# Patient Record
Sex: Female | Born: 1970 | Race: White | Hispanic: No | Marital: Married | Smoking: Former smoker
Health system: Southern US, Community
[De-identification: ages and names within clinical notes are randomized; demographics above are authoritative.]

## PROBLEM LIST (undated history)

## (undated) ENCOUNTER — Emergency Department (HOSPITAL_BASED_OUTPATIENT_CLINIC_OR_DEPARTMENT_OTHER)

## (undated) DIAGNOSIS — R112 Nausea with vomiting, unspecified: Secondary | ICD-10-CM

## (undated) DIAGNOSIS — G4700 Insomnia, unspecified: Secondary | ICD-10-CM

## (undated) DIAGNOSIS — C801 Malignant (primary) neoplasm, unspecified: Secondary | ICD-10-CM

## (undated) DIAGNOSIS — N6452 Nipple discharge: Secondary | ICD-10-CM

## (undated) DIAGNOSIS — K219 Gastro-esophageal reflux disease without esophagitis: Secondary | ICD-10-CM

## (undated) DIAGNOSIS — M1990 Unspecified osteoarthritis, unspecified site: Secondary | ICD-10-CM

## (undated) DIAGNOSIS — E039 Hypothyroidism, unspecified: Secondary | ICD-10-CM

## (undated) DIAGNOSIS — K644 Residual hemorrhoidal skin tags: Secondary | ICD-10-CM

## (undated) DIAGNOSIS — F419 Anxiety disorder, unspecified: Secondary | ICD-10-CM

## (undated) DIAGNOSIS — R040 Epistaxis: Secondary | ICD-10-CM

## (undated) DIAGNOSIS — N644 Mastodynia: Secondary | ICD-10-CM

## (undated) DIAGNOSIS — D649 Anemia, unspecified: Secondary | ICD-10-CM

## (undated) DIAGNOSIS — Z9889 Other specified postprocedural states: Secondary | ICD-10-CM

## (undated) DIAGNOSIS — Z9289 Personal history of other medical treatment: Secondary | ICD-10-CM

## (undated) HISTORY — PX: STAPLE HEMORRHOIDECTOMY: SHX2438

## (undated) HISTORY — DX: Nipple discharge: N64.52

## (undated) HISTORY — DX: Residual hemorrhoidal skin tags: K64.4

## (undated) HISTORY — PX: OTHER SURGICAL HISTORY: SHX169

## (undated) HISTORY — PX: ABDOMINAL HYSTERECTOMY: SHX81

## (undated) HISTORY — DX: Mastodynia: N64.4

## (undated) HISTORY — DX: Epistaxis: R04.0

## (undated) HISTORY — PX: ESSURE TUBAL LIGATION: SUR464

## (undated) HISTORY — PX: ADENOIDECTOMY: SUR15

## (undated) HISTORY — PX: EYE SURGERY: SHX253

## (undated) HISTORY — PX: WISDOM TOOTH EXTRACTION: SHX21

---
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
## 1990-12-18 HISTORY — PX: TONSILLECTOMY: SUR1361

## 2003-06-19 ENCOUNTER — Inpatient Hospital Stay (HOSPITAL_COMMUNITY): Admission: AD | Admit: 2003-06-19 | Discharge: 2003-06-21 | Payer: Self-pay | Admitting: Obstetrics and Gynecology

## 2003-07-30 ENCOUNTER — Other Ambulatory Visit: Admission: RE | Admit: 2003-07-30 | Discharge: 2003-07-30 | Payer: Self-pay | Admitting: Obstetrics and Gynecology

## 2003-12-19 HISTORY — PX: BREAST SURGERY: SHX581

## 2004-08-17 ENCOUNTER — Encounter: Admission: RE | Admit: 2004-08-17 | Discharge: 2004-08-17 | Payer: Self-pay | Admitting: Obstetrics and Gynecology

## 2004-11-02 ENCOUNTER — Ambulatory Visit (HOSPITAL_COMMUNITY): Admission: RE | Admit: 2004-11-02 | Discharge: 2004-11-02 | Payer: Self-pay | Admitting: *Deleted

## 2004-11-02 ENCOUNTER — Ambulatory Visit (HOSPITAL_BASED_OUTPATIENT_CLINIC_OR_DEPARTMENT_OTHER): Admission: RE | Admit: 2004-11-02 | Discharge: 2004-11-02 | Payer: Self-pay | Admitting: *Deleted

## 2004-11-02 ENCOUNTER — Encounter (INDEPENDENT_AMBULATORY_CARE_PROVIDER_SITE_OTHER): Payer: Self-pay | Admitting: Specialist

## 2006-08-14 ENCOUNTER — Encounter: Admission: RE | Admit: 2006-08-14 | Discharge: 2006-08-14 | Payer: Self-pay | Admitting: Obstetrics and Gynecology

## 2006-08-27 ENCOUNTER — Encounter (INDEPENDENT_AMBULATORY_CARE_PROVIDER_SITE_OTHER): Payer: Self-pay | Admitting: *Deleted

## 2006-08-27 ENCOUNTER — Ambulatory Visit (HOSPITAL_BASED_OUTPATIENT_CLINIC_OR_DEPARTMENT_OTHER): Admission: RE | Admit: 2006-08-27 | Discharge: 2006-08-27 | Payer: Self-pay | Admitting: General Surgery

## 2006-08-27 HISTORY — PX: BREAST BIOPSY: SHX20

## 2007-08-21 ENCOUNTER — Encounter: Admission: RE | Admit: 2007-08-21 | Discharge: 2007-08-21 | Payer: Self-pay | Admitting: Obstetrics and Gynecology

## 2008-07-24 ENCOUNTER — Encounter: Admission: RE | Admit: 2008-07-24 | Discharge: 2008-07-24 | Payer: Self-pay | Admitting: Obstetrics and Gynecology

## 2009-09-06 ENCOUNTER — Encounter: Admission: RE | Admit: 2009-09-06 | Discharge: 2009-09-06 | Payer: Self-pay | Admitting: Obstetrics and Gynecology

## 2010-02-21 ENCOUNTER — Ambulatory Visit: Payer: Self-pay | Admitting: Genetic Counselor

## 2010-06-16 ENCOUNTER — Ambulatory Visit (HOSPITAL_COMMUNITY): Admission: RE | Admit: 2010-06-16 | Discharge: 2010-06-16 | Payer: Self-pay | Admitting: Obstetrics and Gynecology

## 2010-09-07 ENCOUNTER — Encounter: Admission: RE | Admit: 2010-09-07 | Discharge: 2010-09-07 | Payer: Self-pay | Admitting: Obstetrics and Gynecology

## 2010-12-22 ENCOUNTER — Ambulatory Visit (HOSPITAL_COMMUNITY)
Admission: RE | Admit: 2010-12-22 | Discharge: 2010-12-22 | Payer: Self-pay | Source: Home / Self Care | Attending: Obstetrics and Gynecology | Admitting: Obstetrics and Gynecology

## 2010-12-27 ENCOUNTER — Encounter
Admission: RE | Admit: 2010-12-27 | Discharge: 2010-12-27 | Payer: Self-pay | Source: Home / Self Care | Attending: Obstetrics and Gynecology | Admitting: Obstetrics and Gynecology

## 2011-01-03 ENCOUNTER — Ambulatory Visit (HOSPITAL_COMMUNITY)
Admission: RE | Admit: 2011-01-03 | Discharge: 2011-01-03 | Payer: Self-pay | Source: Home / Self Care | Attending: Interventional Radiology | Admitting: Interventional Radiology

## 2011-01-04 LAB — CBC
HCT: 44.1 % (ref 36.0–46.0)
Hemoglobin: 15.1 g/dL — ABNORMAL HIGH (ref 12.0–15.0)
MCH: 32.2 pg (ref 26.0–34.0)
MCHC: 34.2 g/dL (ref 30.0–36.0)
MCV: 94 fL (ref 78.0–100.0)
Platelets: 249 10*3/uL (ref 150–400)
RBC: 4.69 MIL/uL (ref 3.87–5.11)
RDW: 12.2 % (ref 11.5–15.5)
WBC: 4.9 10*3/uL (ref 4.0–10.5)

## 2011-01-09 ENCOUNTER — Encounter: Payer: Self-pay | Admitting: Obstetrics and Gynecology

## 2011-03-30 ENCOUNTER — Other Ambulatory Visit: Payer: Self-pay | Admitting: Interventional Radiology

## 2011-03-30 DIAGNOSIS — I872 Venous insufficiency (chronic) (peripheral): Secondary | ICD-10-CM

## 2011-03-30 DIAGNOSIS — R102 Pelvic and perineal pain: Secondary | ICD-10-CM

## 2011-04-04 ENCOUNTER — Encounter: Payer: Self-pay | Admitting: Radiology

## 2011-05-05 NOTE — Op Note (Signed)
Alyssa Contreras, Alyssa Contreras               ACCOUNT NO.:  [id number]   MEDICAL RECORD NO.:  [id number]          PATIENT TYPE:  AMB   LOCATION:  [facility]                         FACILITY:  [facility]   PHYSICIAN:  Vikki Ports, MDDATE OF BIRTH:  04-22-1971   DATE OF PROCEDURE:  11/02/2004  DATE OF DISCHARGE:                                 OPERATIVE REPORT   PREOPERATIVE DIAGNOSIS:  Grade 2 and 3 internal hemorrhoids.   POSTOPERATIVE DIAGNOSIS:  Grade 2 and 3 internal hemorrhoids.   PROCEDURE:  Procedure for prolapse and hemorrhoids retropexy.   SURGEON:  Vikki Ports, MD   ANESTHESIA:  General.   DESCRIPTION OF PROCEDURE:  Patient was taken to the operating room and  placed in the prone jack-knife position.  Rectal and perianal prep were  undertaken.  Additional anal dilatation was accomplished at three fingers.  Hemorrhoidal bundle were injected at 0.25% Marcaine with Wydase.  Internal  and external sphincter muscles were injected with 0.25 Marcaine.  A 2-0  Prolene purse-string suture was placed 5 cm proximal to the dentate line.  I  had difficulty initially getting the stapler above the purse-string;  therefore, the purse-string was removed.  The patient's position made it  difficult to visualize but using a different scope, I managed a very  adequate purse-string suture, placed the stapler in, held it close for 30  seconds and fired.  I had a good donut of approximately 2 cm of hemorrhoidal  tissue.  No evidence of muscularis.  The staple line was inspected.  There  was a minimal amount of oozing, which was controlled with electrocautery.  Gelfoam packing was placed.  Patient tolerated the procedure well and went  to the PACU in good condition.     Gaylyn Rong   KRH/MEDQ  D:  11/02/2004  T:  11/02/2004  Job:  [id number]

## 2011-05-05 NOTE — Op Note (Signed)
Alyssa Contreras, Alyssa Contreras               ACCOUNT NO.:  [id number]   MEDICAL RECORD NO.:  [id number]          PATIENT TYPE:  AMB   LOCATION:  [facility]                          FACILITY:  [facility]   PHYSICIAN:  Rose Phi. Maple Hudson, M.D.   DATE OF BIRTH:  [birth year]   DATE OF PROCEDURE:  08/27/2006  DATE OF DISCHARGE:                                 OPERATIVE REPORT   PREOPERATIVE DIAGNOSIS:  Intraductal papilloma of the left breast.   POSTOPERATIVE DIAGNOSIS:  Intraductal papilloma of the left breast.   OPERATION:  Excision of duct system, left breast.   SURGEON:  Rose Phi. Maple Hudson, M.D.   ANESTHESIA:  MAC.   OPERATIVE PROCEDURE:  The patient was placed on the operating table with  arms extended on the arm board and the left breast prepped and draped in the  usual fashion.  The area of the papilloma and the bloody nipple discharge  was at the 2 o'clock position, so a circumareolar incision centered at the 2  o'clock position was then outlined with a marking pencil and the area  thoroughly infiltrated with 0.25% Marcaine and 1% Xylocaine.  The incision  was made and then the areolar flap dissected up and then what appeared to be  a papilloma excised with the duct system.  Hemostasis was obtained with the  cautery.  Subcuticular closure of 4-0 Monocryl and Steri-Strips was carried  out.  Dressing applied.  The patient was transferred to the recovery room in  satisfactory condition, having tolerated the procedure well.      Rose Phi. Maple Hudson, M.D.  Electronically Signed     PRY/MEDQ  D:  08/27/2006  T:  08/28/2006  Job:  [id number]

## 2011-05-05 NOTE — H&P (Signed)
   Alyssa Contreras, Alyssa Contreras                         ACCOUNT NO.:  [id number]   MEDICAL RECORD NO.:  [id number]                   PATIENT TYPE:  MAT   LOCATION:  [facility]                                 FACILITY:  [facility]   PHYSICIAN:  Lenoard Aden, M.D.             DATE OF BIRTH:  07/21/1971   DATE OF ADMISSION:  06/27/2003  DATE OF DISCHARGE:                                HISTORY & PHYSICAL   CHIEF COMPLAINT:  History of complicated fourth-degree laceration, for  attempted induction and controlled vaginal delivery.   HISTORY:  This is a 40 year old white female G3, P1-0-1-1, EDD is June 27, 2003, at 5 weeks' with aforementioned indication for induction.   PAST MEDICAL HISTORY:  1. Remarkable for a complicated fourth-degree laceration at time of delivery     in July 2002, 8 pound 11 ounce fetus.  2. History of Pergonal induction with 2002, pregnancy.  3. History of a myringotomy, 1974 and 1975.  4. Eye surgery, 1973.  5. Hernia repair, 1976.  6. Inguinal hernia repair and T&A, 1976, and again in 1992.  7. History of migraine headaches.   FAMILY HISTORY:  Breast, bone, and lung cancer, prostate and thyroid issues.   LABORATORY DATA:  Blood type O positive, Rh negative, rubella immune,  hepatitis B surface negative, HIV nonreactive.   PHYSICAL EXAMINATION:  GENERAL:  This is a well-developed, well-nourished  white female in no acute distress.  HEENT:  Normal.  LUNGS:  Clear.  HEART:  Regular rate and rhythm.  ABDOMEN:  Soft, gravid, and nontender.  Fetal weight by ultrasound 8-8.5  pounds.  The cervix is 2 cm thick.  Vertex is -1.   IMPRESSION:  1. Thirty-eight weeks' pregnancy.  2. History of complicated fourth-degree laceration, for induction.   PLAN:  To proceed with attempted induction.  The risks and benefits were  discussed.  The risk of recurrent fourth-degree laceration and possible anal  incontinence were discussed.  The patient acknowledges and wishes to   proceed.                                               Lenoard Aden, M.D.   RJT/MEDQ  D:  06/18/2003  T:  06/18/2003  Job:  [id number]

## 2011-05-05 NOTE — Op Note (Signed)
   Alyssa Contreras, Alyssa Contreras                         ACCOUNT NO.:  [id number]   MEDICAL RECORD NO.:  [id number]                   PATIENT TYPE:  INP   LOCATION:  9130                                 FACILITY:  [facility]   PHYSICIAN:  Lenoard Aden, M.D.             DATE OF BIRTH:  1971/09/19   DATE OF PROCEDURE:  DATE OF DISCHARGE:                                 OPERATIVE REPORT   INDICATION FOR OPERATIVE DELIVERY:  Maternal exhaustion, prolonged second  phase, patient pushing times two and a half hours, fetal vertex at OP and +4  station, Kiwi cup explained with a small incidence of scalp laceration,  cephalohematoma and intrafetal hemorrhage discussed.   SURGEON:  Lenoard Aden, M.D.   PROCEDURE:  The Kiwi cup was placed in the proper location for two pulls  over a central median episiotomy of a full term living female, Apgars 8 and  9, no evidence of nuchal cord noted.  The placenta delivered spontaneously  intact, three vessel cord.  No cervical or vaginal lacerations.  Repair of a  central median episiotomy with 3-0 Monocryl in a continuous running fashion.  The rectum is intact.   ESTIMATED BLOOD LOSS:  500 mL.   COMPLICATIONS:  None.  Mother and baby are recovering in good condition.                                               Lenoard Aden, M.D.    RJT/MEDQ  D:  06/19/2003  T:  06/20/2003  Job:  [id number]

## 2011-07-20 ENCOUNTER — Other Ambulatory Visit: Payer: Self-pay | Admitting: Obstetrics and Gynecology

## 2011-07-20 DIAGNOSIS — N6315 Unspecified lump in the right breast, overlapping quadrants: Secondary | ICD-10-CM

## 2011-07-21 ENCOUNTER — Ambulatory Visit
Admission: RE | Admit: 2011-07-21 | Discharge: 2011-07-21 | Disposition: A | Discharge status: Home / Self Care | Payer: BC Managed Care – PPO | Source: Ambulatory Visit | Attending: Obstetrics and Gynecology | Admitting: Obstetrics and Gynecology

## 2011-07-21 DIAGNOSIS — N6315 Unspecified lump in the right breast, overlapping quadrants: Secondary | ICD-10-CM

## 2011-08-08 ENCOUNTER — Ambulatory Visit (INDEPENDENT_AMBULATORY_CARE_PROVIDER_SITE_OTHER): Payer: BC Managed Care – PPO | Admitting: General Surgery

## 2011-08-22 ENCOUNTER — Ambulatory Visit (INDEPENDENT_AMBULATORY_CARE_PROVIDER_SITE_OTHER): Payer: BC Managed Care – PPO | Admitting: General Surgery

## 2011-08-28 ENCOUNTER — Ambulatory Visit (INDEPENDENT_AMBULATORY_CARE_PROVIDER_SITE_OTHER): Payer: BC Managed Care – PPO | Admitting: General Surgery

## 2011-08-28 ENCOUNTER — Encounter (INDEPENDENT_AMBULATORY_CARE_PROVIDER_SITE_OTHER): Payer: Self-pay | Admitting: General Surgery

## 2011-08-28 DIAGNOSIS — N6310 Unspecified lump in the right breast, unspecified quadrant: Secondary | ICD-10-CM | POA: Insufficient documentation

## 2011-08-28 DIAGNOSIS — N63 Unspecified lump in unspecified breast: Secondary | ICD-10-CM

## 2011-08-28 NOTE — Patient Instructions (Signed)
Plan for right breast core biopsy

## 2011-08-29 ENCOUNTER — Telehealth (INDEPENDENT_AMBULATORY_CARE_PROVIDER_SITE_OTHER): Payer: Self-pay | Admitting: General Surgery

## 2011-08-30 ENCOUNTER — Telehealth (INDEPENDENT_AMBULATORY_CARE_PROVIDER_SITE_OTHER): Payer: Self-pay | Admitting: General Surgery

## 2011-08-30 ENCOUNTER — Other Ambulatory Visit (INDEPENDENT_AMBULATORY_CARE_PROVIDER_SITE_OTHER): Payer: Self-pay | Admitting: General Surgery

## 2011-08-30 DIAGNOSIS — N6310 Unspecified lump in the right breast, unspecified quadrant: Secondary | ICD-10-CM

## 2011-08-30 NOTE — Telephone Encounter (Signed)
I SPOKE WITH PT THIS AM RE PHONE CALL FROM YESTERDAY RE SCHEDULING OF NEEDLE BX OF BREAST AT [facility] [place].

## 2011-08-31 ENCOUNTER — Encounter (INDEPENDENT_AMBULATORY_CARE_PROVIDER_SITE_OTHER): Payer: Self-pay | Admitting: General Surgery

## 2011-08-31 NOTE — Progress Notes (Signed)
Subjective:     Patient ID: Alyssa Contreras, female   DOB: [birth year], 40 y.o.   MRN: [id number]  HPI We are asked to see the patient in consultation by Dr. Tilden Fossa to evaluate her for a right breast mass. The patient is a 40 year old white female who has noticed him lump in the upper outer aspect of the right breast for about the last 6 months. The mass is painful for her. She actually complains of diffuse breast tenderness that seems to come and go with her menstrual cycle. She has had no discharge from her nipple on either side. As part of her workup she underwent mammogram and ultrasound which did show an isoechoic mass approximately 1.3 cm in diameter in the 10:00 position of the right breast.  Review of Systems  Constitutional: Negative.   HENT: Negative.   Eyes: Negative.   Respiratory: Negative.   Cardiovascular: Negative.   Gastrointestinal: Negative.   Genitourinary: Negative.   Musculoskeletal: Negative.   Skin: Negative.   Neurological: Negative.   Hematological: Negative.   Psychiatric/Behavioral: Negative.        Objective:   Physical Exam  Constitutional: She is oriented to person, place, and time. She appears well-developed and well-nourished.  HENT:  Head: Normocephalic and atraumatic.  Eyes: Conjunctivae and EOM are normal. Pupils are equal, round, and reactive to light.  Neck: Normal range of motion. Neck supple.  Cardiovascular: Normal rate, regular rhythm and normal heart sounds.   Pulmonary/Chest: Effort normal and breath sounds normal.       The patient has diffuse breast tenderness bilaterally. She has very thick nodular breast tissue bilaterally as well. Her breast tissue is symmetrically thicker in the upper outer quadrant of both breasts. In the upper outer quadrant of the right breast she also has a tender palpable mass that has what feels like sort of right angles to its formation. No axillary supraclavicular or cervical lymphadenopathy on either  side.  Abdominal: Soft. Bowel sounds are normal.  Musculoskeletal: Normal range of motion.  Neurological: She is alert and oriented to person, place, and time.  Skin: Skin is warm and dry.  Psychiatric: She has a normal mood and affect. Her behavior is normal.       Assessment:     The patient has what feels like very fibrocystic dense breasts with diffuse tenderness. There is a definable mass in the upper outer quadrant of the right breast. At this point I think is thought to be further evaluated with ultrasound and core needle biopsy prior to thinking about any sort of invasive surgery.    Plan:     We will plan to order an ultrasound and core needle biopsy of the mass in the upper outer quadrant of the right breast. We will plan to see her back in the next couple weeks to go over the results of this.

## 2011-09-06 ENCOUNTER — Ambulatory Visit
Admission: RE | Admit: 2011-09-06 | Discharge: 2011-09-06 | Disposition: A | Discharge status: Home / Self Care | Payer: BC Managed Care – PPO | Source: Ambulatory Visit | Attending: General Surgery | Admitting: General Surgery

## 2011-09-06 ENCOUNTER — Other Ambulatory Visit (INDEPENDENT_AMBULATORY_CARE_PROVIDER_SITE_OTHER): Payer: Self-pay | Admitting: General Surgery

## 2011-09-06 DIAGNOSIS — N6310 Unspecified lump in the right breast, unspecified quadrant: Secondary | ICD-10-CM

## 2011-09-07 ENCOUNTER — Other Ambulatory Visit: Payer: Self-pay | Admitting: Obstetrics and Gynecology

## 2011-09-07 DIAGNOSIS — N63 Unspecified lump in unspecified breast: Secondary | ICD-10-CM

## 2011-09-14 ENCOUNTER — Ambulatory Visit (INDEPENDENT_AMBULATORY_CARE_PROVIDER_SITE_OTHER): Payer: BC Managed Care – PPO | Admitting: General Surgery

## 2011-09-15 ENCOUNTER — Ambulatory Visit
Admission: RE | Admit: 2011-09-15 | Discharge: 2011-09-15 | Disposition: A | Discharge status: Home / Self Care | Payer: BC Managed Care – PPO | Source: Ambulatory Visit | Attending: Obstetrics and Gynecology | Admitting: Obstetrics and Gynecology

## 2011-09-15 DIAGNOSIS — N63 Unspecified lump in unspecified breast: Secondary | ICD-10-CM

## 2011-09-21 ENCOUNTER — Encounter (INDEPENDENT_AMBULATORY_CARE_PROVIDER_SITE_OTHER): Payer: BC Managed Care – PPO | Admitting: General Surgery

## 2011-09-29 ENCOUNTER — Encounter (INDEPENDENT_AMBULATORY_CARE_PROVIDER_SITE_OTHER): Payer: Self-pay | Admitting: General Surgery

## 2012-01-08 ENCOUNTER — Ambulatory Visit (INDEPENDENT_AMBULATORY_CARE_PROVIDER_SITE_OTHER): Payer: BC Managed Care – PPO | Admitting: General Surgery

## 2012-01-08 ENCOUNTER — Encounter (INDEPENDENT_AMBULATORY_CARE_PROVIDER_SITE_OTHER): Payer: Self-pay | Admitting: General Surgery

## 2012-01-08 VITALS — BP 112/68 | HR 66 | Temp 98.2°F | Resp 16 | Ht 65.0 in | Wt 115.0 lb

## 2012-01-08 DIAGNOSIS — N63 Unspecified lump in unspecified breast: Secondary | ICD-10-CM

## 2012-01-08 DIAGNOSIS — N6310 Unspecified lump in the right breast, unspecified quadrant: Secondary | ICD-10-CM

## 2012-01-08 NOTE — Progress Notes (Signed)
Subjective:     Patient ID: Alyssa Contreras, female   DOB: 1971/10/19, 41 y.o.   MRN: [id number]  HPI The patient is a 41 year old white female with a long history of breast pain. Since her last visit we had her undergo a mammogram and ultrasound which failed to show any mass in the right breast. She does have a particular area of linear nodularity in the upper outer aspect of the right breast that is exquisitely tender for compared to the rest of her right breast tissue. She is concerned about this as she has a grandmother and and that  both had breast cancer. Review of Systems  Constitutional: Negative.   HENT: Negative.   Eyes: Negative.   Respiratory: Negative.   Cardiovascular: Negative.   Gastrointestinal: Negative.   Genitourinary: Negative.   Musculoskeletal: Negative.   Skin: Negative.   Neurological: Negative.   Hematological: Negative.   Psychiatric/Behavioral: Negative.        Objective:   Physical Exam  Constitutional: She is oriented to person, place, and time. She appears well-developed and well-nourished.  HENT:  Head: Normocephalic and atraumatic.  Eyes: Conjunctivae and EOM are normal. Pupils are equal, round, and reactive to light.  Neck: Normal range of motion. Neck supple.  Cardiovascular: Normal rate, regular rhythm and normal heart sounds.   Pulmonary/Chest: Effort normal and breath sounds normal.       The patient has thick nodular breast tissue diffusely in the right breast but she also has a discrete area of linear nodularity in the upper outer right breast that is the source of most of her pain  Abdominal: Soft. Bowel sounds are normal.  Musculoskeletal: Normal range of motion.  Lymphadenopathy:    She has no cervical adenopathy.  Neurological: She is alert and oriented to person, place, and time.  Skin: Skin is warm and dry.  Psychiatric: She has a normal mood and affect. Her behavior is normal.       Assessment:     Extremely tender right breast  nodule in the upper outer quadrant    Plan:     Because of her concerns of breast cancer in her pain I believe it would be reasonable to excise the area of tenderness in the upper outer right breast. I've discussed with her in detail the risks and benefits of the operation to remove this area as well some of the technical aspects and she understands and wishes to proceed

## 2012-01-08 NOTE — Patient Instructions (Signed)
Plan for right breast lumpectomy 

## 2012-02-08 ENCOUNTER — Other Ambulatory Visit (HOSPITAL_COMMUNITY)
Admission: RE | Admit: 2012-02-08 | Discharge: 2012-02-08 | Disposition: A | Discharge status: Home / Self Care | Payer: BC Managed Care – PPO | Source: Ambulatory Visit | Attending: General Surgery | Admitting: General Surgery

## 2012-02-08 ENCOUNTER — Other Ambulatory Visit (INDEPENDENT_AMBULATORY_CARE_PROVIDER_SITE_OTHER): Payer: Self-pay | Admitting: General Surgery

## 2012-02-08 DIAGNOSIS — N63 Unspecified lump in unspecified breast: Secondary | ICD-10-CM | POA: Insufficient documentation

## 2012-02-08 DIAGNOSIS — D249 Benign neoplasm of unspecified breast: Secondary | ICD-10-CM

## 2012-02-22 ENCOUNTER — Encounter (INDEPENDENT_AMBULATORY_CARE_PROVIDER_SITE_OTHER): Payer: Self-pay | Admitting: General Surgery

## 2012-02-25 IMAGING — CT CT ABD-PELV W/ CM
1 of 2 series · 15 of 32 positions shown, 19 images · IV contrast (agent unspecified)
Comparison: None.

CLINICAL DATA: Lower quadrant abdominal and groin pain for 2 months
with nausea.  History of bilateral hernia repair 35 years prior.

CT ABDOMEN AND PELVIS WITH CONTRAST
TECHNIQUE: Multidetector CT imaging of the abdomen and pelvis was
performed following the standard protocol during bolus
administration of intravenous contrast.
Contrast: 100 ml Zmnipaque-0QQ intravenously.

[Series 2: routine abdomen/pelvis with · axial · 0.70mm/px · z∈[-478,-62]mm · 15 of 91 slices shown, 19 images]
[im 4/91  soft-tissue]
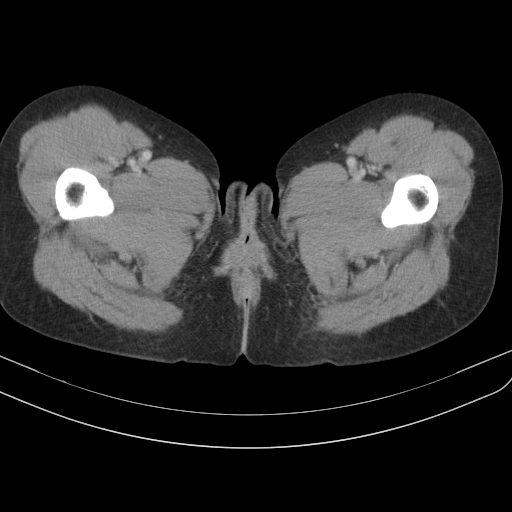
[im 4/91  bone]
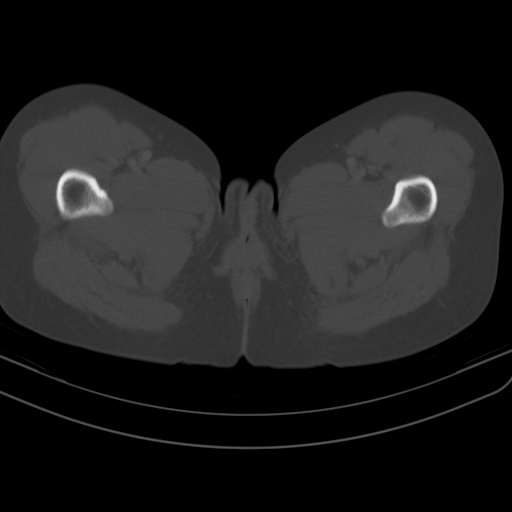
[im 11/91  soft-tissue]
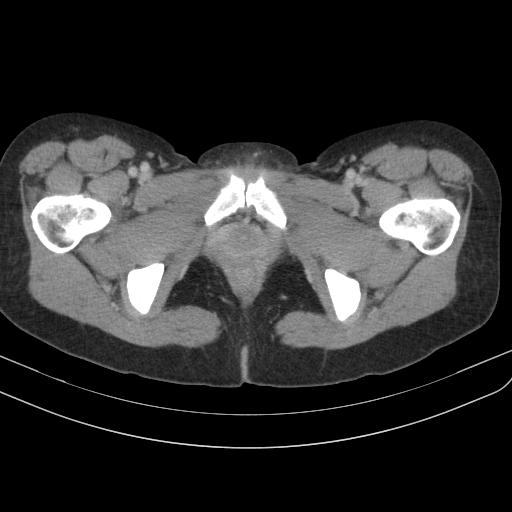
[im 19/91  soft-tissue]
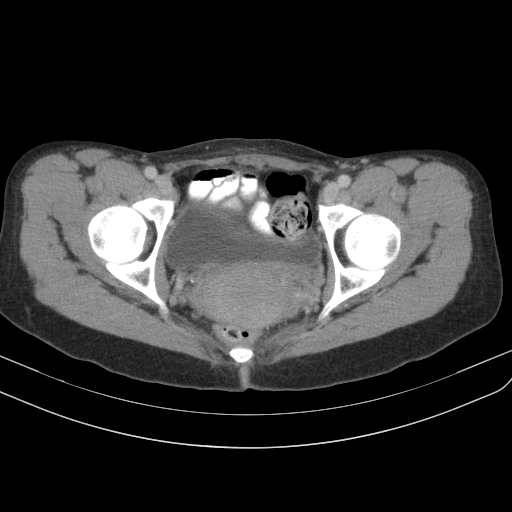
[im 26/91  soft-tissue]
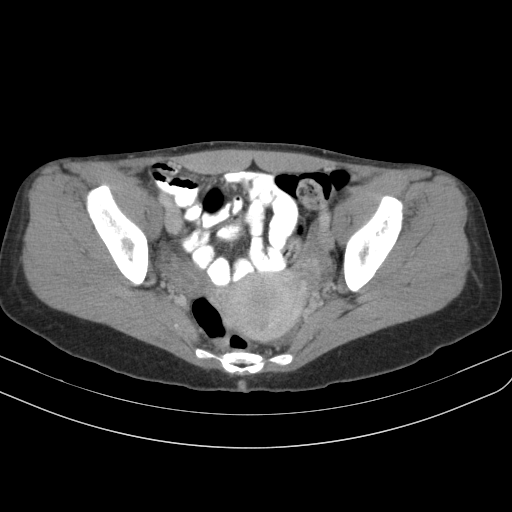
[im 33/91  soft-tissue]
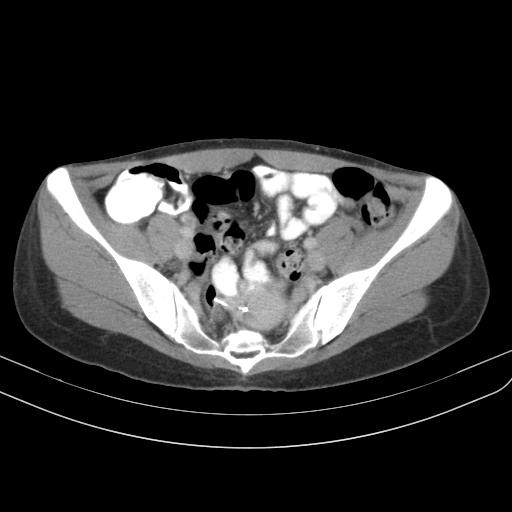
[im 40/91  soft-tissue]
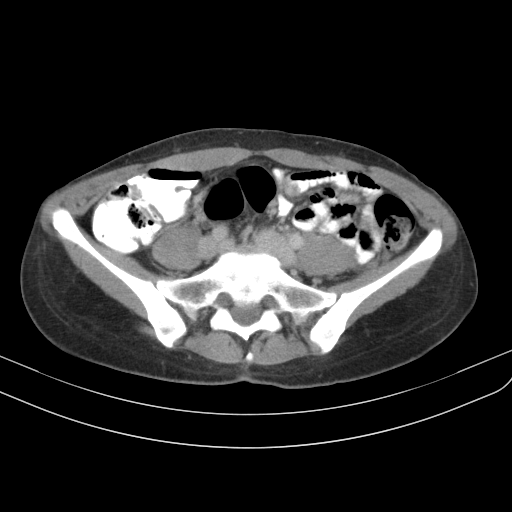
[im 47/91  soft-tissue]
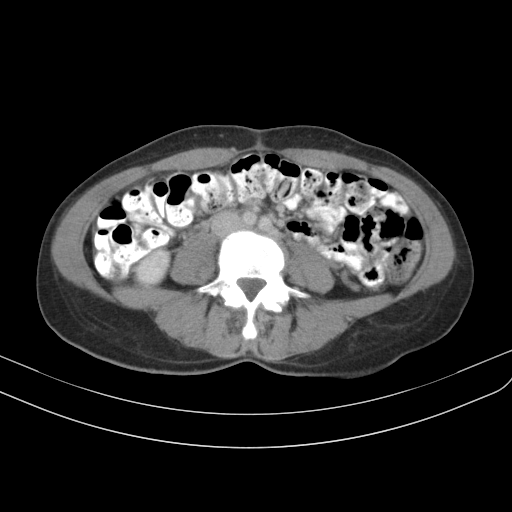
[im 51/91  soft-tissue]
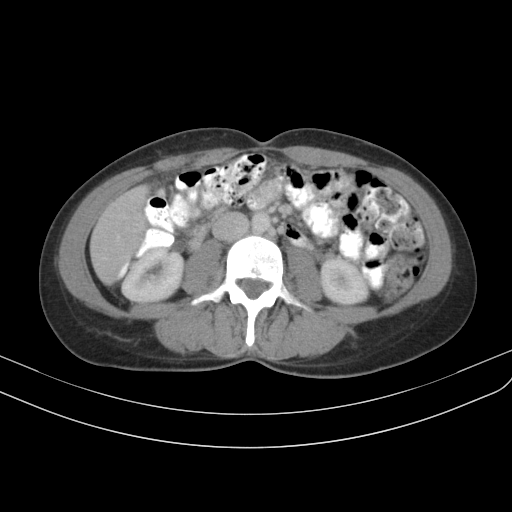
[im 58/91  soft-tissue]
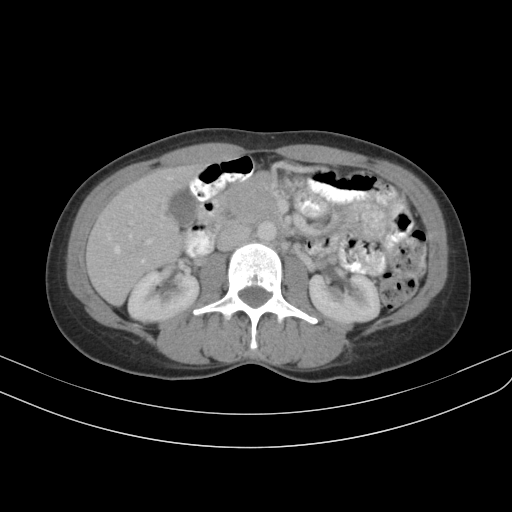
[im 58/91  bone]
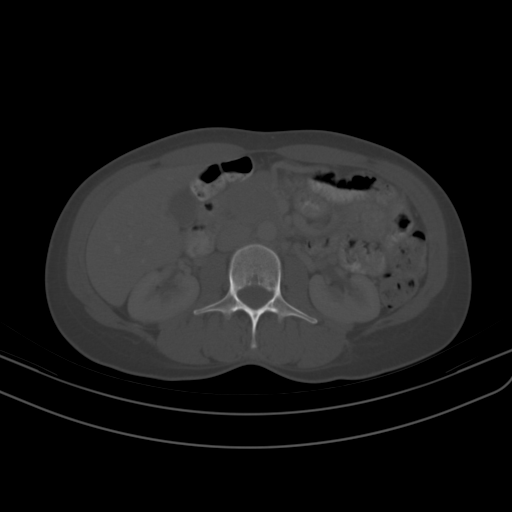
[im 65/91  soft-tissue]
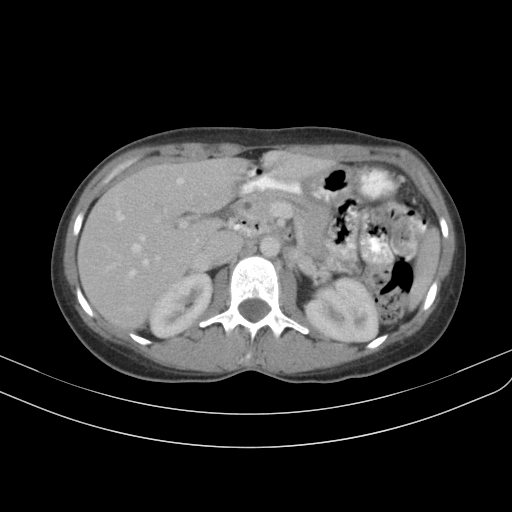
[im 73/91  soft-tissue]
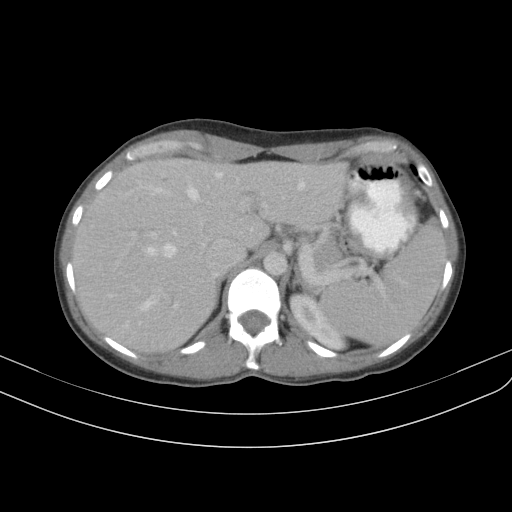
[im 76/91  lung]
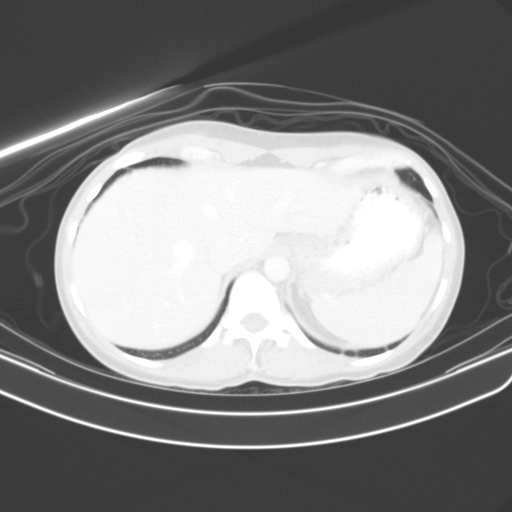
[im 80/91  soft-tissue]
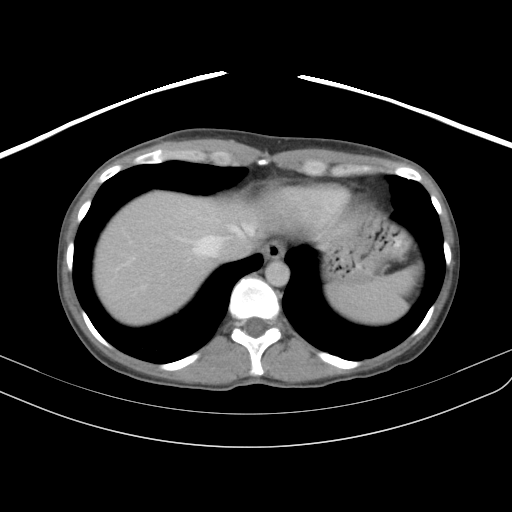
[im 80/91  lung]
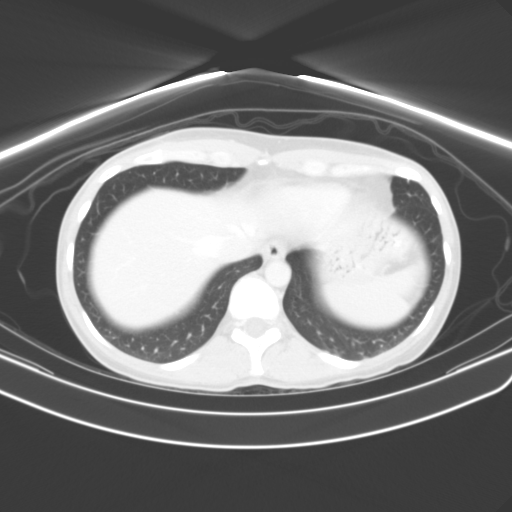
[im 83/91  lung]
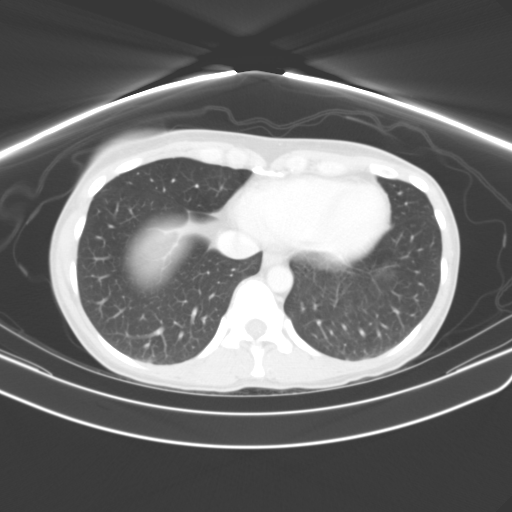
[im 87/91  soft-tissue]
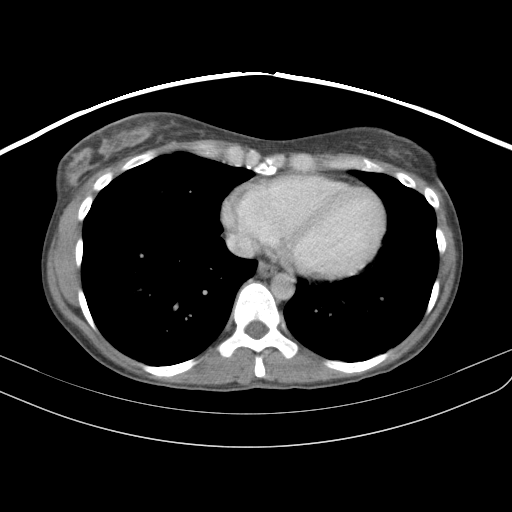
[im 87/91  lung]
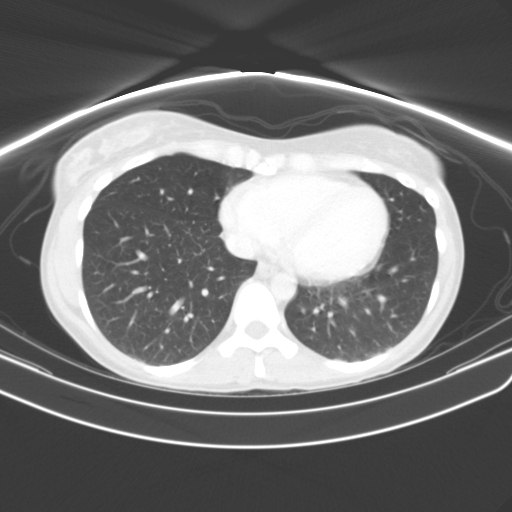

[15 of 32 positions shown; findings below may reference images not displayed]

FINDINGS: The lung bases are clear.  There is no pleural effusion.
Inferiorly in the right hepatic lobe is a 9 mm low density lesion
on image 32.  This appears to have some peripheral enhancement on
the reformatted images and likely reflects a small hemangioma.
There is a similar tiny lesion on image 23.  No other liver lesions
are seen.

The gallbladder, spleen, pancreas, adrenal glands and kidneys
appear normal.

No inflammatory changes or fluid collections are identified within
the pelvis.  Bilateral Essure implants are noted.  There is
nonspecific heterogeneity of the uterus centrally.  No adnexal mass
is identified.  There are mildly asymmetric left pelvic venous
structures.  There are apparent rectal anastomosis clips.  The
bowel gas pattern is normal.  There is no evidence of inguinal
hernia or adenopathy. Degenerative disc disease is noted at L4-L5.
IMPRESSION: 1.  No acute abdominal pelvic findings identified.  Specifically,
no evidence of inguinal hernia or diverticulitis.
2.  Nonspecific central heterogeneous low density in the uterus
could be physiologic or represent submucosal fibroids or
adenomyosis.  This could be further evaluated with pelvic
ultrasound if clinically warranted.
3.  Mildly asymmetric left pelvic venous structures could be a
manifestation of pelvic venous congestion syndrome.
4.  Small liver lesions are incompletely characterized, but
statistically likely to reflect small hemangiomas.

## 2012-02-26 ENCOUNTER — Encounter (INDEPENDENT_AMBULATORY_CARE_PROVIDER_SITE_OTHER): Payer: Self-pay | Admitting: General Surgery

## 2012-02-28 ENCOUNTER — Encounter (INDEPENDENT_AMBULATORY_CARE_PROVIDER_SITE_OTHER): Payer: BC Managed Care – PPO | Admitting: General Surgery

## 2012-03-14 ENCOUNTER — Ambulatory Visit (INDEPENDENT_AMBULATORY_CARE_PROVIDER_SITE_OTHER): Payer: BC Managed Care – PPO | Admitting: General Surgery

## 2012-03-14 ENCOUNTER — Encounter (INDEPENDENT_AMBULATORY_CARE_PROVIDER_SITE_OTHER): Payer: Self-pay | Admitting: General Surgery

## 2012-03-14 VITALS — BP 104/62 | HR 69 | Temp 98.7°F | Ht 65.0 in | Wt 121.8 lb

## 2012-03-14 DIAGNOSIS — N63 Unspecified lump in unspecified breast: Secondary | ICD-10-CM

## 2012-03-14 DIAGNOSIS — N6310 Unspecified lump in the right breast, unspecified quadrant: Secondary | ICD-10-CM

## 2012-03-14 NOTE — Progress Notes (Signed)
Subjective:     Patient ID: Alyssa Contreras, female   DOB: 1971/06/12, 41 y.o.   MRN: [id number]  HPI The patient is a 41 year old white female who is about a month out from a lumpectomy of the right breast for a hamartoma. Since the surgery the pain she was explained to her breast has completely resolved. She feels great and has no complaints today.  Review of Systems     Objective:   Physical Exam On exam her right breast incision is healing very nicely. There is no sign of infection.    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 We will plan to see her back on a p.r.n. basis

## 2012-03-14 NOTE — Patient Instructions (Signed)
May return to all normal activities 

## 2012-09-02 ENCOUNTER — Other Ambulatory Visit: Payer: Self-pay | Admitting: Obstetrics and Gynecology

## 2012-09-02 DIAGNOSIS — Z1231 Encounter for screening mammogram for malignant neoplasm of breast: Secondary | ICD-10-CM

## 2012-09-02 DIAGNOSIS — Z9889 Other specified postprocedural states: Secondary | ICD-10-CM

## 2012-09-17 ENCOUNTER — Ambulatory Visit
Admission: RE | Admit: 2012-09-17 | Discharge: 2012-09-17 | Disposition: A | Discharge status: Home / Self Care | Payer: BC Managed Care – PPO | Source: Ambulatory Visit | Attending: Obstetrics and Gynecology | Admitting: Obstetrics and Gynecology

## 2012-09-17 DIAGNOSIS — Z9889 Other specified postprocedural states: Secondary | ICD-10-CM

## 2012-09-17 DIAGNOSIS — Z1231 Encounter for screening mammogram for malignant neoplasm of breast: Secondary | ICD-10-CM

## 2012-10-25 ENCOUNTER — Other Ambulatory Visit: Payer: Self-pay | Admitting: Obstetrics and Gynecology

## 2012-12-18 HISTORY — PX: BREAST RECONSTRUCTION: SHX9

## 2013-03-17 ENCOUNTER — Other Ambulatory Visit: Payer: Self-pay | Admitting: Obstetrics and Gynecology

## 2013-03-17 DIAGNOSIS — N6315 Unspecified lump in the right breast, overlapping quadrants: Secondary | ICD-10-CM

## 2013-03-18 ENCOUNTER — Other Ambulatory Visit: Payer: Self-pay | Admitting: Obstetrics and Gynecology

## 2013-03-18 DIAGNOSIS — N6310 Unspecified lump in the right breast, unspecified quadrant: Secondary | ICD-10-CM

## 2013-03-26 ENCOUNTER — Ambulatory Visit
Admission: RE | Admit: 2013-03-26 | Discharge: 2013-03-26 | Disposition: A | Discharge status: Home / Self Care | Payer: BC Managed Care – PPO | Source: Ambulatory Visit | Attending: Obstetrics and Gynecology | Admitting: Obstetrics and Gynecology

## 2013-03-26 ENCOUNTER — Other Ambulatory Visit: Payer: Self-pay | Admitting: Obstetrics and Gynecology

## 2013-03-26 DIAGNOSIS — N6315 Unspecified lump in the right breast, overlapping quadrants: Secondary | ICD-10-CM

## 2013-03-26 DIAGNOSIS — C801 Malignant (primary) neoplasm, unspecified: Secondary | ICD-10-CM

## 2013-03-26 HISTORY — DX: Malignant (primary) neoplasm, unspecified: C80.1

## 2013-03-27 ENCOUNTER — Other Ambulatory Visit: Payer: Self-pay | Admitting: Obstetrics and Gynecology

## 2013-03-27 DIAGNOSIS — C50911 Malignant neoplasm of unspecified site of right female breast: Secondary | ICD-10-CM

## 2013-03-31 ENCOUNTER — Ambulatory Visit (INDEPENDENT_AMBULATORY_CARE_PROVIDER_SITE_OTHER): Payer: BC Managed Care – PPO | Admitting: General Surgery

## 2013-03-31 ENCOUNTER — Ambulatory Visit
Admission: RE | Admit: 2013-03-31 | Discharge: 2013-03-31 | Disposition: A | Discharge status: Home / Self Care | Payer: BC Managed Care – PPO | Source: Ambulatory Visit | Attending: Obstetrics and Gynecology | Admitting: Obstetrics and Gynecology

## 2013-03-31 ENCOUNTER — Encounter (INDEPENDENT_AMBULATORY_CARE_PROVIDER_SITE_OTHER): Payer: Self-pay | Admitting: General Surgery

## 2013-03-31 VITALS — BP 124/78 | HR 64 | Resp 14 | Ht 65.0 in | Wt 123.8 lb

## 2013-03-31 DIAGNOSIS — C50919 Malignant neoplasm of unspecified site of unspecified female breast: Secondary | ICD-10-CM

## 2013-03-31 DIAGNOSIS — C50911 Malignant neoplasm of unspecified site of right female breast: Secondary | ICD-10-CM

## 2013-03-31 MED ORDER — GADOBENATE DIMEGLUMINE 529 MG/ML IV SOLN
11.0000 mL | Freq: Once | INTRAVENOUS | Status: AC | PRN
  Administered 2013-03-31: 11 mL via INTRAVENOUS

## 2013-03-31 NOTE — Patient Instructions (Signed)
MRI today Plan for bilateral mastectomy, right sentinel node mapping, and reconstruction. Possible nipple sparing

## 2013-03-31 NOTE — Progress Notes (Signed)
Subjective:     Patient ID: Alyssa Contreras, female   DOB: 13-Jan-1971, 42 y.o.   MRN: [id number]  HPI We are asked to see the patient in consultation by Dr. Jean Rosenthal to evaluate her for new right-sided breast cancer. The patient is a 42 year old white female who had a benign lesion removed from her right breast a year ago. About 6 months ago she started to notice a small lump in the lower inner right breast. It is not significantly enlarged and she first noticed it. She has not had pain associated with the mass. She denies any nipple discharge. She does note that she had an aunt and a grandmother with breast cancer. The mass by ultrasound measured 1.5 cm. 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 Her lymph node have clinically been negative.  Review of Systems  Constitutional: Negative.   HENT: Negative.   Eyes: Negative.   Respiratory: Negative.   Cardiovascular: Negative.   Gastrointestinal: Negative.   Endocrine: Negative.   Genitourinary: Negative.   Musculoskeletal: Negative.   Skin: Negative.   Allergic/Immunologic: Negative.   Neurological: Negative.   Hematological: Negative.   Psychiatric/Behavioral: Negative.        Objective:   Physical Exam  Constitutional: She is oriented to person, place, and time. She appears well-developed and well-nourished.  HENT:  Head: Normocephalic and atraumatic.  Eyes: Conjunctivae and EOM are normal. Pupils are equal, round, and reactive to light.  Neck: Normal range of motion. Neck supple.  Cardiovascular: Normal rate, regular rhythm and normal heart sounds.   Pulmonary/Chest: Effort normal and breath sounds normal.  There is a small palpable mass in the lower inner right breast. She had a significant dense breast tissue bilaterally there is symmetric. There is no palpable axillary or supraclavicular cervical lymphadenopathy.  Abdominal: Soft. Bowel sounds are normal. She exhibits no mass. There is no tenderness.   Musculoskeletal: Normal range of motion.  Lymphadenopathy:    She has no cervical adenopathy.  Neurological: She is alert and oriented to person, place, and time.  Skin: Skin is warm and dry.  Psychiatric: She has a normal mood and affect. Her behavior is normal.       Assessment:     The patient has what appears to be a small early stage right breast cancer in the lower inner quadrant. I've discussed with her in detail the different options for surgical treatment. Because of her family history she favors bilateral nipple sparing mastectomy with reconstruction. She would require right sentinel node mapping. She is scheduled to have her MRI study this morning. If her MRI is similar to the information we are to have an I think this is a reasonable plan.     Plan:     Plan for MRI this morning. We will refer her to Dr. Odis Luster in plastic surgery and Dr. Darnelle Catalan in oncology. She will also need genetic testing. If Dr. Odis Luster agrees and we will plan for bilateral nipple sparing mastectomy and right sentinel node mapping with reconstruction

## 2013-04-01 ENCOUNTER — Telehealth: Payer: Self-pay | Admitting: *Deleted

## 2013-04-01 NOTE — Telephone Encounter (Signed)
Confirmed 04/17/13 med onc appt w/ pt.  Confirmed 04/23/13 genetic appt w/ pt.  Mailed before appt letter & packet to pt.  Emailed Music therapist at [facility] to make her aware.  Took paperwork to Med Rec for chart.

## 2013-04-02 ENCOUNTER — Other Ambulatory Visit (INDEPENDENT_AMBULATORY_CARE_PROVIDER_SITE_OTHER): Payer: Self-pay | Admitting: General Surgery

## 2013-04-02 DIAGNOSIS — C50911 Malignant neoplasm of unspecified site of right female breast: Secondary | ICD-10-CM

## 2013-04-07 ENCOUNTER — Encounter: Payer: Self-pay | Admitting: *Deleted

## 2013-04-07 ENCOUNTER — Other Ambulatory Visit: Payer: Self-pay

## 2013-04-15 ENCOUNTER — Telehealth: Payer: Self-pay | Admitting: *Deleted

## 2013-04-15 NOTE — Telephone Encounter (Signed)
Pt called requesting for me to please cancel her appt w/ Dr. Darnelle Catalan cause she is getting treated elsewhere.  Asked her if she was still coming to see the genetic councilor and she said no, she was going to get tested elsewhere as well.   Cancelled all appt w/ our facility as the pt requested.  Emailed Dr. Carolynne Edouard and Marcelino Duster at [facility] to make them aware.

## 2013-04-17 ENCOUNTER — Other Ambulatory Visit: Payer: BC Managed Care – PPO | Admitting: Lab

## 2013-04-17 ENCOUNTER — Ambulatory Visit: Payer: BC Managed Care – PPO | Admitting: Oncology

## 2013-04-17 ENCOUNTER — Ambulatory Visit: Payer: BC Managed Care – PPO

## 2013-04-23 ENCOUNTER — Other Ambulatory Visit: Payer: BC Managed Care – PPO | Admitting: Lab

## 2013-05-06 ENCOUNTER — Encounter (HOSPITAL_COMMUNITY): Payer: BC Managed Care – PPO

## 2013-05-08 ENCOUNTER — Ambulatory Visit (HOSPITAL_COMMUNITY): Admission: RE | Admit: 2013-05-08 | Payer: BC Managed Care – PPO | Source: Ambulatory Visit | Admitting: General Surgery

## 2013-05-08 ENCOUNTER — Encounter (HOSPITAL_COMMUNITY): Admission: RE | Payer: Self-pay | Source: Ambulatory Visit

## 2013-05-08 ENCOUNTER — Encounter (HOSPITAL_COMMUNITY): Payer: BC Managed Care – PPO

## 2013-05-08 SURGERY — SIMPLE MASTECTOMY
Anesthesia: General | Site: Breast | Laterality: Right

## 2013-06-17 ENCOUNTER — Ambulatory Visit (HOSPITAL_COMMUNITY): Admission: RE | Admit: 2013-06-17 | Payer: BC Managed Care – PPO | Source: Ambulatory Visit

## 2013-06-17 ENCOUNTER — Ambulatory Visit (HOSPITAL_COMMUNITY): Payer: BC Managed Care – PPO

## 2013-06-18 ENCOUNTER — Ambulatory Visit (HOSPITAL_COMMUNITY): Payer: BC Managed Care – PPO

## 2013-06-23 ENCOUNTER — Telehealth (HOSPITAL_COMMUNITY): Payer: Self-pay | Admitting: *Deleted

## 2013-06-23 ENCOUNTER — Ambulatory Visit (HOSPITAL_COMMUNITY)
Admission: RE | Admit: 2013-06-23 | Discharge: 2013-06-23 | Disposition: A | Discharge status: Home / Self Care | Payer: BC Managed Care – PPO | Source: Ambulatory Visit | Attending: Internal Medicine | Admitting: Internal Medicine

## 2013-06-23 DIAGNOSIS — I079 Rheumatic tricuspid valve disease, unspecified: Secondary | ICD-10-CM | POA: Insufficient documentation

## 2013-06-23 DIAGNOSIS — I059 Rheumatic mitral valve disease, unspecified: Secondary | ICD-10-CM | POA: Insufficient documentation

## 2013-06-23 DIAGNOSIS — C50919 Malignant neoplasm of unspecified site of unspecified female breast: Secondary | ICD-10-CM

## 2013-06-23 NOTE — Progress Notes (Signed)
Echo Lab  2D Echocardiogram completed.  Irie Fiorello L Dalisha Shively, RDCS 06/23/2013 8:53 AM

## 2013-06-23 NOTE — Telephone Encounter (Signed)
Echo order placed per Dr Gala Romney

## 2013-06-26 ENCOUNTER — Encounter (HOSPITAL_COMMUNITY): Payer: Self-pay

## 2013-06-26 ENCOUNTER — Ambulatory Visit (HOSPITAL_COMMUNITY)
Admission: RE | Admit: 2013-06-26 | Discharge: 2013-06-26 | Disposition: A | Discharge status: Home / Self Care | Payer: BC Managed Care – PPO | Source: Ambulatory Visit | Attending: Internal Medicine | Admitting: Internal Medicine

## 2013-06-26 VITALS — BP 100/66 | HR 66 | Wt 127.8 lb

## 2013-06-26 DIAGNOSIS — C50911 Malignant neoplasm of unspecified site of right female breast: Secondary | ICD-10-CM

## 2013-06-26 DIAGNOSIS — C50919 Malignant neoplasm of unspecified site of unspecified female breast: Secondary | ICD-10-CM

## 2013-06-26 DIAGNOSIS — R5381 Other malaise: Secondary | ICD-10-CM | POA: Insufficient documentation

## 2013-06-26 DIAGNOSIS — N6310 Unspecified lump in the right breast, unspecified quadrant: Secondary | ICD-10-CM

## 2013-06-26 NOTE — Assessment & Plan Note (Addendum)
Explained purpose of cardio-onc clinic. Dr Gala Romney reviewed and discussed ECHO. Discussed that there is ~10% chance of cardiotoxicity associated with herceptin use. Plan to follow every 3 months with an ECHO while she is on herceptin.   Patient seen and examined with Tonye Becket, NP. We discussed all aspects of the encounter. I agree with the assessment and plan as stated above. Explained incidence of Herceptin cardiotoxicity and role of Cardio-oncology clinic at length. Echo images reviewed personally. All parameters normal. Reviewed signs and symptoms of HF to look for. Continue Herceptin. Follow-up with echo in 3 months.

## 2013-06-26 NOTE — Patient Instructions (Addendum)
Follow up in 3 months with an ECHO and Dr Bensimhon 

## 2013-06-26 NOTE — Progress Notes (Signed)
Patient ID: Alyssa Contreras, female   DOB: 11-Apr-1971, 42 y.o.   MRN: [id number] General Surgeon: Dr Carolynne Edouard Oncologist: Dr Amie Critchley.   HPI:    Alyssa Contreras is a 42 year old very active woman with recently diagnosed triple-positive R sided breast cancer (April 2014). Stage 1.   She has been seeing Dr. Amie Critchley at [facility] for her breast CA and referred to the cardio-oncology program for monitoring. She is being treated with carbolatinum, taxotere, perjeta which started on Apr 23, 2013. Plan for 6 cycles. 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 No HF signs or symptoms.  ECHO today (which Dr. Gala Romney viewed personally) 06/23/13 EF 60%  lateral s' 11.4  Review of Systems:     Cardiac Review of Systems: {Y] = yes [ ]  = no  Chest Pain [    ]  Resting SOB [   ] Exertional SOB  [  ]  Orthopnea [  ]   Pedal Edema [   ]    Palpitations [  ] Syncope  [  ]   Presyncope [   ]  General Review of Systems: [Y] = yes [  ]=no Constitional: recent weight change [  ]; anorexia [  ]; fatigue [ Y ]; nausea [  ]; night sweats [  ]; fever [  ]; or chills [  ];                                                                                                                                          Dental: poor dentition[  ];  Eye : blurred vision [  ]; diplopia [   ]; vision changes [  ];  Amaurosis fugax[  ]; Resp: cough [  ];  wheezing[  ];  hemoptysis[  ]; shortness of breath[  ]; paroxysmal nocturnal dyspnea[  ]; dyspnea on exertion[  ]; or orthopnea[  ];  GI:  gallstones[  ], vomiting[  ];  dysphagia[  ]; melena[  ];  hematochezia [  ]; heartburn[  ];    GU: kidney stones [  ]; hematuria[  ];   dysuria [  ];  nocturia[  ];  history of     obstruction [  ];                 Skin: rash, swelling[  ];, hair loss[  ];  peripheral edema[  ];  or itching[  ]; Musculosketetal: myalgias[  ];  joint swelling[  ];  joint erythema[  ];  joint  pain[  ];  back pain[  ];  Heme/Lymph: bruising[  ];  bleeding[  ];  anemia[  ];  Neuro: TIA[  ];  headaches[  ];  stroke[  ];  vertigo[  ];  seizures[  ];   paresthesias[  ];  difficulty walking[  ];  Psych:depression[  ]; anxiety[  ];  Endocrine: diabetes[  ];  thyroid dysfunction[  ];  Other:    Past Medical History  Diagnosis Date  . Skin tags, anus or rectum   . Hemorrhoids   . Intraductal papilloma   . Nipple discharge   . Breast pain in female   . Bleeding from the nose     Current Outpatient Prescriptions  Medication Sig Dispense Refill  . ARMOUR THYROID PO Take 120 mg by mouth daily.       . sertraline (ZOLOFT) 100 MG tablet        No current facility-administered medications for this encounter.     No Known Allergies  History   Social History  . Marital Status: Married    Spouse Name: N/A    Number of Children: N/A  . Years of Education: N/A   Occupational History  . Not on file.   Social History Main Topics  . Smoking status: Former Games developer  . Smokeless tobacco: Never Used  . Alcohol Use: No  . Drug Use: No  . Sexually Active: Not on file   Other Topics Concern  . Not on file   Social History Narrative  . No narrative on file    Family History  Problem Relation Age of Onset  . Hypertension Mother   . Stroke Mother   . Cirrhosis Mother   . Breast cancer Maternal Grandmother   . Breast cancer Maternal Aunt   . Aneurysm Father     PHYSICAL EXAM: Filed Vitals:   06/26/13 1407  BP: 100/66  Pulse: 66   General:  Well/fit appearing. No respiratory difficulty HEENT: normal x for alopecia Neck: supple. no JVD. Carotids 2+ bilat; no bruits. No lymphadenopathy or thryomegaly appreciated. Cor: PMI nondisplaced. Regular rate & rhythm. No rubs, gallops or murmurs. Lungs: clear Abdomen: soft, nontender, nondistended. No hepatosplenomegaly. No bruits or masses. Good bowel sounds. Extremities: no cyanosis, clubbing, rash, edema Neuro: alert &  oriented x 3, cranial nerves grossly intact. moves all 4 extremities w/o difficulty. Affect pleasant.    No results found for this or any previous visit (from the past 24 hour(s)). No results found.   ASSESSMENT & PLAN:

## 2013-07-18 DIAGNOSIS — Z9289 Personal history of other medical treatment: Secondary | ICD-10-CM

## 2013-07-18 HISTORY — DX: Personal history of other medical treatment: Z92.89

## 2013-07-28 ENCOUNTER — Encounter (HOSPITAL_COMMUNITY): Payer: Self-pay | Admitting: Emergency Medicine

## 2013-07-28 ENCOUNTER — Emergency Department (HOSPITAL_COMMUNITY)
Admission: EM | Admit: 2013-07-28 | Discharge: 2013-07-28 | Disposition: A | Discharge status: Home / Self Care | Payer: BC Managed Care – PPO | Attending: Emergency Medicine | Admitting: Emergency Medicine

## 2013-07-28 DIAGNOSIS — Z8679 Personal history of other diseases of the circulatory system: Secondary | ICD-10-CM | POA: Insufficient documentation

## 2013-07-28 DIAGNOSIS — Z853 Personal history of malignant neoplasm of breast: Secondary | ICD-10-CM | POA: Insufficient documentation

## 2013-07-28 DIAGNOSIS — Z8614 Personal history of Methicillin resistant Staphylococcus aureus infection: Secondary | ICD-10-CM | POA: Insufficient documentation

## 2013-07-28 DIAGNOSIS — L03317 Cellulitis of buttock: Secondary | ICD-10-CM | POA: Insufficient documentation

## 2013-07-28 DIAGNOSIS — Z8709 Personal history of other diseases of the respiratory system: Secondary | ICD-10-CM | POA: Insufficient documentation

## 2013-07-28 DIAGNOSIS — L0291 Cutaneous abscess, unspecified: Secondary | ICD-10-CM

## 2013-07-28 DIAGNOSIS — Z872 Personal history of diseases of the skin and subcutaneous tissue: Secondary | ICD-10-CM | POA: Insufficient documentation

## 2013-07-28 DIAGNOSIS — Z79899 Other long term (current) drug therapy: Secondary | ICD-10-CM | POA: Insufficient documentation

## 2013-07-28 DIAGNOSIS — Z8742 Personal history of other diseases of the female genital tract: Secondary | ICD-10-CM | POA: Insufficient documentation

## 2013-07-28 DIAGNOSIS — L0231 Cutaneous abscess of buttock: Secondary | ICD-10-CM | POA: Insufficient documentation

## 2013-07-28 DIAGNOSIS — IMO0002 Reserved for concepts with insufficient information to code with codable children: Secondary | ICD-10-CM | POA: Insufficient documentation

## 2013-07-28 MED ORDER — CLINDAMYCIN HCL 150 MG PO CAPS: 150.0000 mg | ORAL_CAPSULE | Freq: Four times a day (QID) | ORAL | Status: DC

## 2013-07-28 MED ORDER — BUPIVACAINE-EPINEPHRINE PF 0.25-1:200000 % IJ SOLN
INTRAMUSCULAR | Status: AC
  Filled 2013-07-28: qty 30

## 2013-07-28 NOTE — ED Notes (Signed)
Patient reports that she has a large swelling to her buttocks. She reports that she has a history of MRSA. The patient has ache all over and flu like symtims as well.   She is post chemo

## 2013-07-28 NOTE — ED Provider Notes (Signed)
CSN: [id number]     Arrival date & time 07/28/13  1938 History     First MD Initiated Contact with Patient 07/28/13 2042     Chief Complaint  Patient presents with  . Cyst   (Consider location/radiation/quality/duration/timing/severity/associated sxs/prior Treatment) The history is provided by the patient.   patient here complaining of swelling to her distal right block. History of MRSA recently and treated with Bactrim. Describes flulike symptoms at home but denies any temperature. No vomiting or diarrhea. Symptoms worse of the past 3 days and without drainage. Pain characterized as sharp and also worse with walking. Sclera receiving chemotherapy for breast cancer and last was 12 days ago which is also when her mrsa treatment with Bactrim was discontinued  Past Medical History  Diagnosis Date  . Skin tags, anus or rectum   . Hemorrhoids   . Intraductal papilloma   . Nipple discharge   . Breast pain in female   . Bleeding from the nose    Past Surgical History  Procedure Laterality Date  . Staple hemorrhoidectomy    . Breast surgery  2005    left papilloma  . Tonsillectomy  1992  . Hernia repair  1977    double  . Breast biopsy  08/27/2006    left   Family History  Problem Relation Age of Onset  . Hypertension Mother   . Stroke Mother   . Cirrhosis Mother   . Breast cancer Maternal Grandmother   . Breast cancer Maternal Aunt   . Aneurysm Father    History  Substance Use Topics  . Smoking status: Former Games developer  . Smokeless tobacco: Never Used  . Alcohol Use: No   OB History   Grav Para Term Preterm Abortions TAB SAB Ect Mult Living                 Review of Systems  All other systems reviewed and are negative.    Allergies  Review of patient's allergies indicates no known allergies.  Home Medications   Current Outpatient Rx  Name  Route  Sig  Dispense  Refill  . dexamethasone (DECADRON) 4 MG tablet   Oral   Take 8 mg by mouth 2 (two) times daily with  a meal. Takes 8mg  twice daily, 1 day before Chemo, then takes 8mg  twice daily for 2 days after Chemo.         Marland Kitchen granisetron (SANCUSO) 3.1 MG/24HR   Transdermal   Place 1 patch onto the skin once. Apply to skin starting 24 hours before chemotherapy. Remove after 7 days.         Marland Kitchen loratadine (CLARITIN) 10 MG tablet   Oral   Take 10 mg by mouth daily. Takes usually 1 tablet daily, butTakes two tablets on days 3 and 4 after chemo, for neulasta inejctions         . LORazepam (ATIVAN) 1 MG tablet   Oral   Take 1 mg by mouth at bedtime as needed for anxiety.         . naproxen sodium (ANAPROX) 220 MG tablet   Oral   Take 440 mg by mouth daily as needed (for pain).         Marland Kitchen PRESCRIPTION MEDICATION   Apply externally   Apply 1 application topically 2 (two) times daily. "altobax"         . sennosides-docusate sodium (SENOKOT-S) 8.6-50 MG tablet   Oral   Take 2 tablets by mouth daily.         Marland Kitchen  sertraline (ZOLOFT) 100 MG tablet   Oral   Take 100 mg by mouth daily.          Marland Kitchen thyroid (ARMOUR) 60 MG tablet   Oral   Take 120 mg by mouth daily before breakfast.         . sulfamethoxazole-trimethoprim (BACTRIM DS,SEPTRA DS) 800-160 MG per tablet   Oral   Take 1 tablet by mouth 2 (two) times daily.          BP 102/68  Pulse 89  Temp(Src) 98.8 F (37.1 C) (Oral)  Resp 20  Ht 5\' 5"  (1.651 m)  Wt 120 lb (54.432 kg)  BMI 19.97 kg/m2  SpO2 100% Physical Exam  Nursing note and vitals reviewed. Constitutional: She is oriented to person, place, and time. She appears well-developed and well-nourished.  Non-toxic appearance. No distress.  HENT:  Head: Normocephalic and atraumatic.  Eyes: Conjunctivae, EOM and lids are normal. Pupils are equal, round, and reactive to light.  Neck: Normal range of motion. Neck supple. No tracheal deviation present. No mass present.  Cardiovascular: Normal rate, regular rhythm and normal heart sounds.  Exam reveals no gallop.   No  murmur heard. Pulmonary/Chest: Effort normal and breath sounds normal. No stridor. No respiratory distress. She has no decreased breath sounds. She has no wheezes. She has no rhonchi. She has no rales.  Abdominal: Soft. Normal appearance and bowel sounds are normal. She exhibits no distension. There is no tenderness. There is no rebound and no CVA tenderness.  Genitourinary:     Musculoskeletal: Normal range of motion. She exhibits no edema and no tenderness.  Neurological: She is alert and oriented to person, place, and time. She has normal strength. No cranial nerve deficit or sensory deficit. GCS eye subscore is 4. GCS verbal subscore is 5. GCS motor subscore is 6.  Skin: Skin is warm and dry. No abrasion and no rash noted.  Psychiatric: She has a normal mood and affect. Her speech is normal and behavior is normal.    ED Course   INCISION AND DRAINAGE Date/Time: 07/28/2013 9:39 PM Performed by: Toy Baker Authorized by: Lorre Nick T Consent: Verbal consent obtained. Risks and benefits: risks, benefits and alternatives were discussed Patient identity confirmed: verbally with patient Time out: Immediately prior to procedure a "time out" was called to verify the correct patient, procedure, equipment, support staff and site/side marked as required. Type: abscess Body area: lower extremity Location details: right buttock Anesthesia: local infiltration Local anesthetic: bupivacaine 0.25% with epinephrine Anesthetic total: 15 ml Patient sedated: no Scalpel size: 10 Incision type: single straight Complexity: simple Drainage: purulent Drainage amount: moderate Wound treatment: wound left open Patient tolerance: Patient tolerated the procedure well with no immediate complications.   (including critical care time)  Labs Reviewed - No data to display No results found. No diagnosis found.  MDM  Patient is abscess was then drained and she was placed on clindamycin and  follow her Dr.  Toy Baker, MD 07/28/13 2219

## 2013-08-28 HISTORY — PX: MASTECTOMY: SHX3

## 2013-09-29 ENCOUNTER — Ambulatory Visit (HOSPITAL_COMMUNITY): Payer: BC Managed Care – PPO

## 2013-09-29 ENCOUNTER — Encounter (HOSPITAL_COMMUNITY): Payer: BC Managed Care – PPO

## 2013-10-07 ENCOUNTER — Ambulatory Visit (HOSPITAL_BASED_OUTPATIENT_CLINIC_OR_DEPARTMENT_OTHER)
Admission: RE | Admit: 2013-10-07 | Discharge: 2013-10-07 | Disposition: A | Discharge status: Home / Self Care | Payer: BC Managed Care – PPO | Source: Ambulatory Visit | Attending: Internal Medicine | Admitting: Internal Medicine

## 2013-10-07 ENCOUNTER — Encounter (INDEPENDENT_AMBULATORY_CARE_PROVIDER_SITE_OTHER): Payer: Self-pay

## 2013-10-07 ENCOUNTER — Ambulatory Visit (HOSPITAL_COMMUNITY)
Admission: RE | Admit: 2013-10-07 | Discharge: 2013-10-07 | Disposition: A | Discharge status: Home / Self Care | Payer: BC Managed Care – PPO | Source: Ambulatory Visit | Attending: Adult Health | Admitting: Adult Health

## 2013-10-07 VITALS — BP 110/80 | HR 60 | Wt 121.0 lb

## 2013-10-07 DIAGNOSIS — Z87891 Personal history of nicotine dependence: Secondary | ICD-10-CM | POA: Insufficient documentation

## 2013-10-07 DIAGNOSIS — Z09 Encounter for follow-up examination after completed treatment for conditions other than malignant neoplasm: Secondary | ICD-10-CM | POA: Insufficient documentation

## 2013-10-07 DIAGNOSIS — C50919 Malignant neoplasm of unspecified site of unspecified female breast: Secondary | ICD-10-CM | POA: Insufficient documentation

## 2013-10-07 DIAGNOSIS — N63 Unspecified lump in unspecified breast: Secondary | ICD-10-CM

## 2013-10-07 DIAGNOSIS — N6310 Unspecified lump in the right breast, unspecified quadrant: Secondary | ICD-10-CM

## 2013-10-07 NOTE — Progress Notes (Signed)
  Echocardiogram 2D Echocardiogram has been performed.  Alyssa Contreras 10/07/2013, 2:46 PM

## 2013-10-07 NOTE — Progress Notes (Signed)
Patient ID: Alyssa Contreras, female   DOB: Sep 08, 1971, 42 y.o.   MRN: [id number] General Surgeon: Dr Carolynne Edouard Oncologist: Dr Amie Critchley.   HPI:   Alyssa Contreras is a 42 year old very active woman with recently diagnosed triple-positive R sided breast cancer (April 2014). Stage 1. S/P bilateral mastectomies with tissue expanders.   She has been seeing Dr. Amie Critchley at [facility] for her breast CA and referred to the cardio-oncology program for monitoring. She ihas been treated with carbolatinum, taxotere, perjeta which started on Apr 23, 2013 and she completed 5 cycles. She continues on perjeta and herceptin every 3 weeks. Plan to complete Herceptin Apr 29 2014.   She returns for follow up. Complains of fatigue but improving. Denies SOB/PND/Orthopnea. Walks 2 miles 5 days a week. Working full time.    ECHO today (which Dr. Gala Romney viewed personally) 06/23/13 EF 60%  lateral s' 11.4 10/07/13 EF 55%  lateral S' 12.1 Global Strain -16    Past Medical History  Diagnosis Date  . Skin tags, anus or rectum   . Hemorrhoids   . Intraductal papilloma   . Nipple discharge   . Breast pain in female   . Bleeding from the nose     Current Outpatient Prescriptions  Medication Sig Dispense Refill  . LORazepam (ATIVAN) 1 MG tablet Take 1 mg by mouth at bedtime as needed for anxiety.      . sertraline (ZOLOFT) 100 MG tablet Take 100 mg by mouth daily.       Marland Kitchen thyroid (ARMOUR) 60 MG tablet Take 120 mg by mouth daily before breakfast.       No current facility-administered medications for this encounter.     No Known Allergies  History   Social History  . Marital Status: Married    Spouse Name: N/A    Number of Children: N/A  . Years of Education: N/A   Occupational History  . Not on file.   Social History Main Topics  . Smoking status: Former Games developer  . Smokeless tobacco: Never Used  . Alcohol Use: No  . Drug Use: No  . Sexual Activity: Not on file   Other Topics Concern  . Not on file   Social  History Narrative  . No narrative on file    Family History  Problem Relation Age of Onset  . Hypertension Mother   . Stroke Mother   . Cirrhosis Mother   . Breast cancer Maternal Grandmother   . Breast cancer Maternal Aunt   . Aneurysm Father     PHYSICAL EXAM: Filed Vitals:   10/07/13 1452  BP: 110/80  Pulse: 60   General:  Well/fit appearing. No respiratory difficulty HEENT: normal Neck: supple. no JVD. Carotids 2+ bilat; no bruits. No lymphadenopathy or thryomegaly appreciated. Cor: PMI nondisplaced. Regular rate & rhythm. No rubs, gallops or murmurs. Lungs: clear Abdomen: soft, nontender, nondistended. No hepatosplenomegaly. No bruits or masses. Good bowel sounds. Extremities: no cyanosis, clubbing, rash, edema Neuro: alert & oriented x 3, cranial nerves grossly intact. moves all 4 extremities w/o difficulty. Affect pleasant.    No results found for this or any previous visit (from the past 24 hour(s)). No results found.   ASSESSMENT & PLAN:   1. Breast Cancer. Expected completion date for Herceptin Apr 19, 2014.  Dr Gala Romney reviewed and discussed ECHO. She continues on perjeta and herceptin every 3 weeks. EF and lateral S' stable. Follow up in 3 months with an ECHO.   Follow up in  3 months with ECHO.  CLEGG,AMY 2:58 PM  Patient seen and examined with Tonye Becket, NP. We discussed all aspects of the encounter. I agree with the assessment and plan as stated above. I reviewed echos personally. EF and Doppler parameters stable. No HF on exam. Continue Herceptin.   Alexie Lanni,MD 3:14 PM

## 2013-10-07 NOTE — Patient Instructions (Addendum)
Follow up in 3 months with an ECHO 

## 2014-02-11 ENCOUNTER — Other Ambulatory Visit: Payer: Self-pay | Admitting: Obstetrics and Gynecology

## 2014-03-31 ENCOUNTER — Encounter (HOSPITAL_COMMUNITY): Payer: Self-pay | Admitting: Pharmacist

## 2014-04-03 ENCOUNTER — Encounter (HOSPITAL_COMMUNITY): Payer: BC Managed Care – PPO

## 2014-04-07 ENCOUNTER — Inpatient Hospital Stay (HOSPITAL_COMMUNITY): Admission: RE | Admit: 2014-04-07 | Payer: BC Managed Care – PPO | Source: Ambulatory Visit

## 2014-04-08 ENCOUNTER — Other Ambulatory Visit (HOSPITAL_COMMUNITY): Payer: Self-pay | Admitting: *Deleted

## 2014-04-09 ENCOUNTER — Encounter (HOSPITAL_COMMUNITY)
Admission: RE | Admit: 2014-04-09 | Discharge: 2014-04-09 | Disposition: A | Discharge status: Home / Self Care | Payer: BC Managed Care – PPO | Source: Ambulatory Visit | Attending: Obstetrics and Gynecology | Admitting: Obstetrics and Gynecology

## 2014-04-09 DIAGNOSIS — Z853 Personal history of malignant neoplasm of breast: Secondary | ICD-10-CM | POA: Diagnosis not present

## 2014-04-09 DIAGNOSIS — T451X5A Adverse effect of antineoplastic and immunosuppressive drugs, initial encounter: Secondary | ICD-10-CM | POA: Insufficient documentation

## 2014-04-09 DIAGNOSIS — Z79899 Other long term (current) drug therapy: Secondary | ICD-10-CM | POA: Diagnosis not present

## 2014-04-09 DIAGNOSIS — D6189 Other specified aplastic anemias and other bone marrow failure syndromes: Secondary | ICD-10-CM | POA: Insufficient documentation

## 2014-04-09 DIAGNOSIS — Z5181 Encounter for therapeutic drug level monitoring: Secondary | ICD-10-CM | POA: Diagnosis not present

## 2014-04-09 DIAGNOSIS — Z0000 Encounter for general adult medical examination without abnormal findings: Secondary | ICD-10-CM | POA: Diagnosis present

## 2014-04-09 MED ORDER — HEPARIN SOD (PORK) LOCK FLUSH 100 UNIT/ML IV SOLN: 500.0000 [IU] | INTRAVENOUS | Status: DC | PRN

## 2014-04-09 MED ORDER — SODIUM CHLORIDE 0.9 % IV SOLN
510.0000 mg | INTRAVENOUS | Status: DC
  Administered 2014-04-09: 510 mg via INTRAVENOUS
  Filled 2014-04-09: qty 17

## 2014-04-09 MED ORDER — HEPARIN SOD (PORK) LOCK FLUSH 100 UNIT/ML IV SOLN
INTRAVENOUS | Status: AC
  Administered 2014-04-09: 500 [IU]
  Filled 2014-04-09: qty 5

## 2014-04-09 NOTE — Discharge Instructions (Signed)

## 2014-04-13 ENCOUNTER — Encounter (HOSPITAL_COMMUNITY)
Admission: RE | Admit: 2014-04-13 | Discharge: 2014-04-13 | Disposition: A | Discharge status: Home / Self Care | Payer: BC Managed Care – PPO | Source: Ambulatory Visit | Attending: Obstetrics and Gynecology | Admitting: Obstetrics and Gynecology

## 2014-04-13 DIAGNOSIS — D6189 Other specified aplastic anemias and other bone marrow failure syndromes: Secondary | ICD-10-CM | POA: Diagnosis not present

## 2014-04-13 MED ORDER — SODIUM CHLORIDE 0.9 % IV SOLN
510.0000 mg | INTRAVENOUS | Status: DC
  Administered 2014-04-13: 510 mg via INTRAVENOUS
  Filled 2014-04-13: qty 17

## 2014-04-13 MED ORDER — HEPARIN SOD (PORK) LOCK FLUSH 100 UNIT/ML IV SOLN
INTRAVENOUS | Status: AC
  Filled 2014-04-13: qty 5

## 2014-04-13 MED ORDER — HEPARIN SOD (PORK) LOCK FLUSH 100 UNIT/ML IV SOLN
500.0000 [IU] | INTRAVENOUS | Status: AC | PRN
  Administered 2014-04-13: 500 [IU]

## 2014-04-13 NOTE — H&P (Signed)
Alyssa Contreras is an 43 year old G 2 P 2 desires LAVH/BSO. History of breast cancer - desires oophorectomy.  Pertinent Gynecological History: Menses: flow is light Bleeding: normal Contraception: tubal ligation DES exposure: denies Blood transfusions: previous related to breast cancer treatment Sexually transmitted diseases: no past history Previous GYN Procedures: ESSURE and treatment of pelvic congestion  Last mammogram: abnormal: breast cancer - status post treatment Date: 2014 Last pap: normal Date: 2015 OB History: G2, P2  Menstrual History: Menarche age: unknown  No LMP recorded.    Past Medical History  Diagnosis Date  . Skin tags, anus or rectum   . Hemorrhoids   . Intraductal papilloma   . Nipple discharge   . Breast pain in female   . Bleeding from the nose   Breast Cancer - status post bilateral mastectomy and reconstruction / chemotherapy - treatment at [facility]  Past Surgical History  Procedure Laterality Date  . Staple hemorrhoidectomy    . Breast surgery  2005    left papilloma  . Tonsillectomy  1992  . Hernia repair  1977    double  . Breast biopsy  08/27/2006    left  Bilateral mastectomy with reconstruction has portacath still in place Family History  Problem Relation Age of Onset  . Hypertension Mother   . Stroke Mother   . Cirrhosis Mother   . Breast cancer Maternal Grandmother   . Breast cancer Maternal Aunt   . Aneurysm Father     Social History:  reports that she has quit smoking. She has never used smokeless tobacco. She reports that she does not drink alcohol or use illicit drugs.  Allergies:  Allergies  Allergen Reactions  . Metoclopramide     Feels skin crawls  . Vancomycin     Red man's syndrome -- but has received vancomycin slowly for MRSA and tolerated it this way    No prescriptions prior to admission    Review of Systems  All other systems reviewed and are negative.   There were no vitals taken for this visit. Physical  Exam  Nursing note and vitals reviewed. Constitutional: She is oriented to person, place, and time. She appears well-developed.  HENT:  Head: Normocephalic.  Eyes: Pupils are equal, round, and reactive to light.  Neck: Normal range of motion.  Cardiovascular: Normal rate.   Respiratory: Effort normal.  GI: Soft.  Genitourinary: Vagina normal.  Musculoskeletal: Normal range of motion.  Neurological: She is alert and oriented to person, place, and time.  Skin: Skin is warm and dry.    No results found for this or any previous visit (from the past 24 hour(s)).  No results found.  Assessment/Plan: History of breast Cancer LAVH / BSO Risks reviewed Consent signed  Cyril Mourning 04/13/2014, 12:50 PM

## 2014-04-14 ENCOUNTER — Encounter (HOSPITAL_COMMUNITY): Payer: Self-pay

## 2014-04-14 ENCOUNTER — Encounter (HOSPITAL_COMMUNITY)
Admission: RE | Admit: 2014-04-14 | Discharge: 2014-04-14 | Disposition: A | Discharge status: Home / Self Care | Payer: BC Managed Care – PPO | Source: Ambulatory Visit | Attending: Obstetrics and Gynecology | Admitting: Obstetrics and Gynecology

## 2014-04-14 HISTORY — DX: Anxiety disorder, unspecified: F41.9

## 2014-04-14 HISTORY — DX: Other specified postprocedural states: Z98.890

## 2014-04-14 HISTORY — DX: Insomnia, unspecified: G47.00

## 2014-04-14 HISTORY — DX: Gastro-esophageal reflux disease without esophagitis: K21.9

## 2014-04-14 HISTORY — DX: Anemia, unspecified: D64.9

## 2014-04-14 HISTORY — DX: Personal history of other medical treatment: Z92.89

## 2014-04-14 HISTORY — DX: Nausea with vomiting, unspecified: R11.2

## 2014-04-14 HISTORY — DX: Malignant (primary) neoplasm, unspecified: C80.1

## 2014-04-14 HISTORY — DX: Unspecified osteoarthritis, unspecified site: M19.90

## 2014-04-14 HISTORY — DX: Hypothyroidism, unspecified: E03.9

## 2014-04-14 LAB — CBC
HCT: 45.8 % (ref 36.0–46.0)
Hemoglobin: 15.1 g/dL — ABNORMAL HIGH (ref 12.0–15.0)
MCH: 29.4 pg (ref 26.0–34.0)
MCHC: 33 g/dL (ref 30.0–36.0)
MCV: 89.1 fL (ref 78.0–100.0)
PLATELETS: 227 10*3/uL (ref 150–400)
RBC: 5.14 MIL/uL — ABNORMAL HIGH (ref 3.87–5.11)
RDW: 15.3 % (ref 11.5–15.5)
WBC: 4.4 10*3/uL (ref 4.0–10.5)

## 2014-04-14 MED ORDER — CEFAZOLIN SODIUM-DEXTROSE 2-3 GM-% IV SOLR
2.0000 g | INTRAVENOUS | Status: AC
  Administered 2014-04-15: 2 g via INTRAVENOUS

## 2014-04-14 NOTE — Anesthesia Preprocedure Evaluation (Addendum)
Anesthesia Evaluation    History of Anesthesia Complications (+) PONV  Airway Mallampati: II TM Distance: >3 FB Neck ROM: full    Dental  (+) Teeth Intact   Pulmonary former smoker,  breath sounds clear to auscultation  Pulmonary exam normal       Cardiovascular negative cardio ROS  Rhythm:regular Rate:Normal     Neuro/Psych Anxiety negative neurological ROS     GI/Hepatic Neg liver ROS, GERD- (history during chemo, none now)  ,  Endo/Other  Hypothyroidism   Renal/GU negative Renal ROS  negative genitourinary   Musculoskeletal   Abdominal Normal abdominal exam  (+)   Peds  Hematology Breast CA - s/p mastectomy/chemo   Anesthesia Other Findings   Reproductive/Obstetrics negative OB ROS                          Anesthesia Physical Anesthesia Plan  ASA: II  Anesthesia Plan: General ETT   Post-op Pain Management:    Induction: Intravenous  Airway Management Planned: Oral ETT  Additional Equipment:   Intra-op Plan:   Post-operative Plan:   Informed Consent: I have reviewed the patients History and Physical, chart, labs and discussed the procedure including the risks, benefits and alternatives for the proposed anesthesia with the patient or authorized representative who has indicated his/her understanding and acceptance.   Dental Advisory Given  Plan Discussed with: Surgeon, CRNA and Anesthesiologist  Anesthesia Plan Comments: (Plan TIVA for history of severe PONV.  Plan PONV prophylaxis.  Note reglan allergy.)       Anesthesia Quick Evaluation

## 2014-04-14 NOTE — Patient Instructions (Addendum)
   Your procedure is scheduled on:  Wednesday, April 29  Enter through the [employer] [facility] at:  6 AM Pick up the phone at the desk and dial [phone] and inform us of your arrival.  Please call this number if you have any problems the morning of surgery: [phone]  Remember: Do not eat or drink after midnight: Tonight - Tuesday Take these medicines the morning of surgery with a SIP OF WATER:  Zoloft, armour  Do not wear jewelry, make-up, or FINGER nail polish No metal in your hair or on your body. Do not wear lotions, powders, perfumes.  You may wear deodorant.  Do not bring valuables to the hospital. Contacts, dentures or bridgework may not be worn into surgery.  Leave suitcase in the car. After Surgery it may be brought to your room. For patients being admitted to the hospital, checkout time is 11:00am the day of discharge.  Home with husband Lennette Bihari cell [phone]

## 2014-04-14 NOTE — Pre-Procedure Instructions (Signed)
SDS BB History Log given to Lab for patient's history of blood transfusion.

## 2014-04-15 ENCOUNTER — Encounter (HOSPITAL_COMMUNITY)
Admission: RE | Disposition: A | Discharge status: Home / Self Care | Payer: Self-pay | Source: Ambulatory Visit | Attending: Obstetrics and Gynecology

## 2014-04-15 ENCOUNTER — Encounter (HOSPITAL_COMMUNITY): Payer: BC Managed Care – PPO | Admitting: Anesthesiology

## 2014-04-15 ENCOUNTER — Encounter (HOSPITAL_COMMUNITY): Payer: Self-pay | Admitting: *Deleted

## 2014-04-15 ENCOUNTER — Inpatient Hospital Stay (HOSPITAL_COMMUNITY)
Admission: RE | Admit: 2014-04-15 | Discharge: 2014-04-17 | DRG: 941 | Disposition: A | Discharge status: Home / Self Care | Payer: BC Managed Care – PPO | Source: Ambulatory Visit | Attending: Obstetrics and Gynecology | Admitting: Obstetrics and Gynecology

## 2014-04-15 ENCOUNTER — Ambulatory Visit (HOSPITAL_COMMUNITY): Payer: BC Managed Care – PPO | Admitting: Anesthesiology

## 2014-04-15 DIAGNOSIS — N838 Other noninflammatory disorders of ovary, fallopian tube and broad ligament: Secondary | ICD-10-CM | POA: Diagnosis present

## 2014-04-15 DIAGNOSIS — Z853 Personal history of malignant neoplasm of breast: Secondary | ICD-10-CM

## 2014-04-15 DIAGNOSIS — Z823 Family history of stroke: Secondary | ICD-10-CM

## 2014-04-15 DIAGNOSIS — Z4009 Encounter for prophylactic removal of other organ: Principal | ICD-10-CM

## 2014-04-15 DIAGNOSIS — D280 Benign neoplasm of vulva: Secondary | ICD-10-CM | POA: Diagnosis present

## 2014-04-15 DIAGNOSIS — E039 Hypothyroidism, unspecified: Secondary | ICD-10-CM | POA: Diagnosis present

## 2014-04-15 DIAGNOSIS — Z8249 Family history of ischemic heart disease and other diseases of the circulatory system: Secondary | ICD-10-CM

## 2014-04-15 DIAGNOSIS — Z87891 Personal history of nicotine dependence: Secondary | ICD-10-CM

## 2014-04-15 DIAGNOSIS — K649 Unspecified hemorrhoids: Secondary | ICD-10-CM | POA: Diagnosis present

## 2014-04-15 DIAGNOSIS — N6310 Unspecified lump in the right breast, unspecified quadrant: Secondary | ICD-10-CM

## 2014-04-15 DIAGNOSIS — K219 Gastro-esophageal reflux disease without esophagitis: Secondary | ICD-10-CM | POA: Diagnosis present

## 2014-04-15 DIAGNOSIS — Z90710 Acquired absence of both cervix and uterus: Secondary | ICD-10-CM | POA: Diagnosis present

## 2014-04-15 DIAGNOSIS — D279 Benign neoplasm of unspecified ovary: Secondary | ICD-10-CM | POA: Diagnosis present

## 2014-04-15 DIAGNOSIS — F411 Generalized anxiety disorder: Secondary | ICD-10-CM | POA: Diagnosis present

## 2014-04-15 DIAGNOSIS — Z803 Family history of malignant neoplasm of breast: Secondary | ICD-10-CM

## 2014-04-15 DIAGNOSIS — C50919 Malignant neoplasm of unspecified site of unspecified female breast: Secondary | ICD-10-CM

## 2014-04-15 HISTORY — PX: LAPAROSCOPIC ASSISTED VAGINAL HYSTERECTOMY: SHX5398

## 2014-04-15 HISTORY — PX: SALPINGOOPHORECTOMY: SHX82

## 2014-04-15 HISTORY — PX: EXCISION OF SKIN TAG: SHX6270

## 2014-04-15 SURGERY — HYSTERECTOMY, VAGINAL, LAPAROSCOPY-ASSISTED
Anesthesia: General | Site: Vulva | Laterality: Right

## 2014-04-15 MED ORDER — DIPHENHYDRAMINE HCL 50 MG/ML IJ SOLN: 12.5000 mg | Freq: Four times a day (QID) | INTRAMUSCULAR | Status: DC | PRN

## 2014-04-15 MED ORDER — ONDANSETRON HCL 4 MG/2ML IJ SOLN
INTRAMUSCULAR | Status: AC
  Filled 2014-04-15: qty 2

## 2014-04-15 MED ORDER — PHENYLEPHRINE HCL 10 MG/ML IJ SOLN
10.0000 mg | INTRAVENOUS | Status: DC | PRN
  Administered 2014-04-15: 50 ug/min via INTRAVENOUS

## 2014-04-15 MED ORDER — NEOSTIGMINE METHYLSULFATE 1 MG/ML IJ SOLN
INTRAMUSCULAR | Status: AC
  Filled 2014-04-15: qty 1

## 2014-04-15 MED ORDER — SODIUM CHLORIDE 0.9 % IJ SOLN
INTRAMUSCULAR | Status: DC | PRN
  Administered 2014-04-15: 10 mL

## 2014-04-15 MED ORDER — SCOPOLAMINE 1 MG/3DAYS TD PT72
1.0000 | MEDICATED_PATCH | TRANSDERMAL | Status: DC
  Administered 2014-04-15: 1.5 mg via TRANSDERMAL

## 2014-04-15 MED ORDER — DEXAMETHASONE SODIUM PHOSPHATE 10 MG/ML IJ SOLN
INTRAMUSCULAR | Status: DC | PRN
  Administered 2014-04-15: 10 mg via INTRAVENOUS

## 2014-04-15 MED ORDER — HYDROMORPHONE HCL PF 1 MG/ML IJ SOLN
0.2500 mg | INTRAMUSCULAR | Status: DC | PRN
  Administered 2014-04-15 (×4): 0.5 mg via INTRAVENOUS

## 2014-04-15 MED ORDER — THYROID 120 MG PO TABS
120.0000 mg | ORAL_TABLET | Freq: Every day | ORAL | Status: DC
  Administered 2014-04-16 – 2014-04-17 (×2): 120 mg via ORAL
  Filled 2014-04-15 (×2): qty 1

## 2014-04-15 MED ORDER — PALONOSETRON HCL INJECTION 0.25 MG/5ML
0.2500 mg | Freq: Once | INTRAVENOUS | Status: AC
  Administered 2014-04-15: 0.25 mg via INTRAVENOUS
  Filled 2014-04-15: qty 5

## 2014-04-15 MED ORDER — FENTANYL CITRATE 0.05 MG/ML IJ SOLN
INTRAMUSCULAR | Status: AC
  Filled 2014-04-15: qty 2

## 2014-04-15 MED ORDER — PROCHLORPERAZINE EDISYLATE 5 MG/ML IJ SOLN
10.0000 mg | Freq: Four times a day (QID) | INTRAMUSCULAR | Status: DC | PRN
  Administered 2014-04-15: 10 mg via INTRAVENOUS
  Filled 2014-04-15: qty 2

## 2014-04-15 MED ORDER — EPHEDRINE SULFATE 50 MG/ML IJ SOLN
INTRAMUSCULAR | Status: DC | PRN
  Administered 2014-04-15: 10 mg via INTRAVENOUS

## 2014-04-15 MED ORDER — SODIUM CHLORIDE 0.9 % IV SOLN
150.0000 mg | Freq: Once | INTRAVENOUS | Status: AC
  Administered 2014-04-15: 150 mg via INTRAVENOUS
  Filled 2014-04-15: qty 5

## 2014-04-15 MED ORDER — BUPIVACAINE HCL (PF) 0.25 % IJ SOLN
INTRAMUSCULAR | Status: DC | PRN
  Administered 2014-04-15: 25 mL

## 2014-04-15 MED ORDER — MIDAZOLAM HCL 2 MG/2ML IJ SOLN
0.5000 mg | Freq: Once | INTRAMUSCULAR | Status: AC | PRN
  Administered 2014-04-15: 0.5 mg via INTRAVENOUS

## 2014-04-15 MED ORDER — PROPOFOL 10 MG/ML IV BOLUS
INTRAVENOUS | Status: DC | PRN
  Administered 2014-04-15: 50 mg via INTRAVENOUS
  Administered 2014-04-15: 200 mg via INTRAVENOUS
  Administered 2014-04-15 (×3): 50 mg via INTRAVENOUS

## 2014-04-15 MED ORDER — OXYCODONE-ACETAMINOPHEN 5-325 MG PO TABS: 1.0000 | ORAL_TABLET | ORAL | Status: DC | PRN

## 2014-04-15 MED ORDER — FENTANYL CITRATE 0.05 MG/ML IJ SOLN
INTRAMUSCULAR | Status: DC | PRN
  Administered 2014-04-15 (×5): 50 ug via INTRAVENOUS
  Administered 2014-04-15: 100 ug via INTRAVENOUS

## 2014-04-15 MED ORDER — DEXTROSE IN LACTATED RINGERS 5 % IV SOLN
INTRAVENOUS | Status: DC
  Administered 2014-04-15 – 2014-04-16 (×3): via INTRAVENOUS

## 2014-04-15 MED ORDER — DEXAMETHASONE SODIUM PHOSPHATE 10 MG/ML IJ SOLN
10.0000 mg | Freq: Once | INTRAMUSCULAR | Status: AC
  Administered 2014-04-15: 10 mg via INTRAVENOUS
  Filled 2014-04-15: qty 1

## 2014-04-15 MED ORDER — SODIUM CHLORIDE 0.9 % IJ SOLN
INTRAMUSCULAR | Status: AC
  Filled 2014-04-15: qty 10

## 2014-04-15 MED ORDER — SERTRALINE HCL 100 MG PO TABS
100.0000 mg | ORAL_TABLET | Freq: Every day | ORAL | Status: DC
  Administered 2014-04-16: 100 mg via ORAL
  Filled 2014-04-15 (×2): qty 1

## 2014-04-15 MED ORDER — BELLADONNA ALKALOIDS-OPIUM 16.2-60 MG RE SUPP
1.0000 | Freq: Once | RECTAL | Status: AC
  Administered 2014-04-15: 1 via RECTAL
  Filled 2014-04-15: qty 1

## 2014-04-15 MED ORDER — GLYCOPYRROLATE 0.2 MG/ML IJ SOLN
INTRAMUSCULAR | Status: DC | PRN
  Administered 2014-04-15: 0.2 mg via INTRAVENOUS
  Administered 2014-04-15 (×2): 0.1 mg via INTRAVENOUS

## 2014-04-15 MED ORDER — CEFAZOLIN SODIUM-DEXTROSE 2-3 GM-% IV SOLR
INTRAVENOUS | Status: AC
  Filled 2014-04-15: qty 50

## 2014-04-15 MED ORDER — ONDANSETRON HCL 4 MG/2ML IJ SOLN
INTRAMUSCULAR | Status: DC | PRN
  Administered 2014-04-15: 4 mg via INTRAVENOUS

## 2014-04-15 MED ORDER — PHENYLEPHRINE HCL 10 MG/ML IJ SOLN
INTRAMUSCULAR | Status: AC
  Filled 2014-04-15: qty 1

## 2014-04-15 MED ORDER — PROPOFOL 10 MG/ML IV EMUL
INTRAVENOUS | Status: AC
  Filled 2014-04-15: qty 20

## 2014-04-15 MED ORDER — HYDROMORPHONE HCL PF 1 MG/ML IJ SOLN
INTRAMUSCULAR | Status: AC
  Filled 2014-04-15: qty 1

## 2014-04-15 MED ORDER — SODIUM CHLORIDE 0.9 % IJ SOLN: 9.0000 mL | INTRAMUSCULAR | Status: DC | PRN

## 2014-04-15 MED ORDER — 0.9 % SODIUM CHLORIDE (POUR BTL) OPTIME
TOPICAL | Status: DC | PRN
  Administered 2014-04-15: 1000 mL

## 2014-04-15 MED ORDER — PROMETHAZINE HCL 25 MG/ML IJ SOLN: 6.2500 mg | INTRAMUSCULAR | Status: DC | PRN

## 2014-04-15 MED ORDER — KETOROLAC TROMETHAMINE 30 MG/ML IJ SOLN: INTRAMUSCULAR | Status: DC | PRN

## 2014-04-15 MED ORDER — ROCURONIUM BROMIDE 100 MG/10ML IV SOLN
INTRAVENOUS | Status: AC
  Filled 2014-04-15: qty 1

## 2014-04-15 MED ORDER — EPHEDRINE 5 MG/ML INJ
INTRAVENOUS | Status: AC
  Filled 2014-04-15: qty 10

## 2014-04-15 MED ORDER — NALOXONE HCL 0.4 MG/ML IJ SOLN: 0.4000 mg | INTRAMUSCULAR | Status: DC | PRN

## 2014-04-15 MED ORDER — LACTATED RINGERS IV SOLN
INTRAVENOUS | Status: DC
  Administered 2014-04-15 (×3): via INTRAVENOUS

## 2014-04-15 MED ORDER — PROPOFOL 10 MG/ML IV EMUL
INTRAVENOUS | Status: AC
  Filled 2014-04-15: qty 120

## 2014-04-15 MED ORDER — HYDROMORPHONE HCL PF 1 MG/ML IJ SOLN
INTRAMUSCULAR | Status: DC | PRN
  Administered 2014-04-15 (×2): 1 mg via INTRAVENOUS

## 2014-04-15 MED ORDER — ESTRADIOL 0.1 MG/GM VA CREA
TOPICAL_CREAM | VAGINAL | Status: AC
  Filled 2014-04-15: qty 42.5

## 2014-04-15 MED ORDER — BUPIVACAINE HCL (PF) 0.25 % IJ SOLN
INTRAMUSCULAR | Status: AC
  Filled 2014-04-15: qty 30

## 2014-04-15 MED ORDER — SODIUM CHLORIDE 0.9 % IV SOLN
INTRAVENOUS | Status: DC
  Filled 2014-04-15 (×2): qty 250

## 2014-04-15 MED ORDER — SODIUM CHLORIDE 0.9 % IJ SOLN
1000.0000 ug | INTRAMUSCULAR | Status: DC | PRN
  Administered 2014-04-15: .25 ug/kg/min via INTRAVENOUS

## 2014-04-15 MED ORDER — LACTATED RINGERS IR SOLN
Status: DC | PRN
  Administered 2014-04-15: 3000 mL

## 2014-04-15 MED ORDER — MIDAZOLAM HCL 2 MG/2ML IJ SOLN
INTRAMUSCULAR | Status: DC | PRN
  Administered 2014-04-15: 2 mg via INTRAVENOUS

## 2014-04-15 MED ORDER — ACETAMINOPHEN 160 MG/5ML PO SOLN
ORAL | Status: AC
  Filled 2014-04-15: qty 40.6

## 2014-04-15 MED ORDER — DEXAMETHASONE SODIUM PHOSPHATE 10 MG/ML IJ SOLN
INTRAMUSCULAR | Status: AC
  Filled 2014-04-15: qty 1

## 2014-04-15 MED ORDER — LIDOCAINE HCL (CARDIAC) 20 MG/ML IV SOLN
INTRAVENOUS | Status: AC
  Filled 2014-04-15: qty 5

## 2014-04-15 MED ORDER — ACETAMINOPHEN 160 MG/5ML PO SOLN
975.0000 mg | Freq: Once | ORAL | Status: AC
  Administered 2014-04-15: 975 mg via ORAL

## 2014-04-15 MED ORDER — HYDROMORPHONE 0.3 MG/ML IV SOLN
INTRAVENOUS | Status: DC
  Administered 2014-04-15: 2.19 mg via INTRAVENOUS
  Administered 2014-04-15: 11:00:00 via INTRAVENOUS
  Administered 2014-04-15: 0.799 mg via INTRAVENOUS
  Filled 2014-04-15: qty 25

## 2014-04-15 MED ORDER — KETOROLAC TROMETHAMINE 30 MG/ML IJ SOLN
15.0000 mg | Freq: Once | INTRAMUSCULAR | Status: AC | PRN
  Administered 2014-04-15: 30 mg via INTRAVENOUS

## 2014-04-15 MED ORDER — IBUPROFEN 600 MG PO TABS
600.0000 mg | ORAL_TABLET | Freq: Four times a day (QID) | ORAL | Status: DC | PRN
  Administered 2014-04-16 (×2): 600 mg via ORAL
  Filled 2014-04-15 (×3): qty 1

## 2014-04-15 MED ORDER — LACTATED RINGERS IV SOLN
INTRAVENOUS | Status: DC
  Administered 2014-04-15: 06:00:00 via INTRAVENOUS

## 2014-04-15 MED ORDER — LIDOCAINE HCL (CARDIAC) 20 MG/ML IV SOLN
INTRAVENOUS | Status: DC | PRN
  Administered 2014-04-15: 80 mg via INTRAVENOUS

## 2014-04-15 MED ORDER — MIDAZOLAM HCL 2 MG/2ML IJ SOLN
INTRAMUSCULAR | Status: AC
  Filled 2014-04-15: qty 2

## 2014-04-15 MED ORDER — HYDROMORPHONE HCL PF 1 MG/ML IJ SOLN
INTRAMUSCULAR | Status: AC
  Administered 2014-04-15: 0.5 mg via INTRAVENOUS
  Filled 2014-04-15: qty 1

## 2014-04-15 MED ORDER — TRAMADOL HCL 50 MG PO TABS
50.0000 mg | ORAL_TABLET | Freq: Four times a day (QID) | ORAL | Status: DC | PRN
  Administered 2014-04-16 (×2): 50 mg via ORAL
  Filled 2014-04-15 (×2): qty 1

## 2014-04-15 MED ORDER — LORAZEPAM 1 MG PO TABS
1.0000 mg | ORAL_TABLET | Freq: Every evening | ORAL | Status: DC | PRN
  Administered 2014-04-16: 1 mg via ORAL
  Filled 2014-04-15: qty 1

## 2014-04-15 MED ORDER — PROPOFOL INFUSION 10 MG/ML OPTIME
INTRAVENOUS | Status: DC | PRN
  Administered 2014-04-15: 75 ug/kg/min via INTRAVENOUS

## 2014-04-15 MED ORDER — KETOROLAC TROMETHAMINE 30 MG/ML IJ SOLN
INTRAMUSCULAR | Status: AC
  Filled 2014-04-15: qty 1

## 2014-04-15 MED ORDER — DIPHENHYDRAMINE HCL 12.5 MG/5ML PO ELIX: 12.5000 mg | ORAL_SOLUTION | Freq: Four times a day (QID) | ORAL | Status: DC | PRN

## 2014-04-15 MED ORDER — MEPERIDINE HCL 25 MG/ML IJ SOLN: 6.2500 mg | INTRAMUSCULAR | Status: DC | PRN

## 2014-04-15 MED ORDER — GLYCOPYRROLATE 0.2 MG/ML IJ SOLN
INTRAMUSCULAR | Status: AC
  Filled 2014-04-15: qty 3

## 2014-04-15 MED ORDER — FENTANYL CITRATE 0.05 MG/ML IJ SOLN
INTRAMUSCULAR | Status: AC
  Filled 2014-04-15: qty 5

## 2014-04-15 MED ORDER — MENTHOL 3 MG MT LOZG: 1.0000 | LOZENGE | OROMUCOSAL | Status: DC | PRN

## 2014-04-15 MED ORDER — ONDANSETRON HCL 4 MG/2ML IJ SOLN: 4.0000 mg | Freq: Four times a day (QID) | INTRAMUSCULAR | Status: DC | PRN

## 2014-04-15 SURGICAL SUPPLY — 42 items
ADH SKN CLS APL DERMABOND .7 (GAUZE/BANDAGES/DRESSINGS) ×3
BARRIER ADHS 3X4 INTERCEED (GAUZE/BANDAGES/DRESSINGS) IMPLANT
BRR ADH 4X3 ABS CNTRL BYND (GAUZE/BANDAGES/DRESSINGS)
CABLE HIGH FREQUENCY MONO STRZ (ELECTRODE) IMPLANT
CHLORAPREP W/TINT 26ML (MISCELLANEOUS) ×5 IMPLANT
CLOTH BEACON ORANGE TIMEOUT ST (SAFETY) ×5 IMPLANT
CONT PATH 16OZ SNAP LID 3702 (MISCELLANEOUS) ×5 IMPLANT
COVER TABLE BACK 60X90 (DRAPES) ×5 IMPLANT
DECANTER SPIKE VIAL GLASS SM (MISCELLANEOUS) ×8 IMPLANT
DERMABOND ADVANCED (GAUZE/BANDAGES/DRESSINGS) ×2
DERMABOND ADVANCED .7 DNX12 (GAUZE/BANDAGES/DRESSINGS) ×3 IMPLANT
ELECT NDL TIP 2.8 STRL (NEEDLE) IMPLANT
ELECT NEEDLE TIP 2.8 STRL (NEEDLE) ×5 IMPLANT
ELECT REM PT RETURN 9FT ADLT (ELECTROSURGICAL) ×5
ELECTRODE REM PT RTRN 9FT ADLT (ELECTROSURGICAL) ×3 IMPLANT
EVACUATOR SMOKE 8.L (FILTER) IMPLANT
GLOVE BIO SURGEON STRL SZ 6.5 (GLOVE) ×4 IMPLANT
GLOVE BIO SURGEONS STRL SZ 6.5 (GLOVE) ×1
GLOVE BIOGEL PI IND STRL 6.5 (GLOVE) ×3 IMPLANT
GLOVE BIOGEL PI INDICATOR 6.5 (GLOVE) ×2
GOWN STRL REUS W/TWL LRG LVL3 (GOWN DISPOSABLE) ×20 IMPLANT
HEMOSTAT SURGICEL 2X14 (HEMOSTASIS) ×2 IMPLANT
NEEDLE INSUFFLATION 120MM (ENDOMECHANICALS) ×5 IMPLANT
NS IRRIG 1000ML POUR BTL (IV SOLUTION) ×5 IMPLANT
PACK LAVH (CUSTOM PROCEDURE TRAY) ×5 IMPLANT
PROTECTOR NERVE ULNAR (MISCELLANEOUS) ×5 IMPLANT
SEALER TISSUE G2 CVD JAW 45CM (ENDOMECHANICALS) ×5 IMPLANT
SET IRRIG TUBING LAPAROSCOPIC (IRRIGATION / IRRIGATOR) ×4 IMPLANT
SOLUTION ELECTROLUBE (MISCELLANEOUS) ×3 IMPLANT
SUT VIC AB 0 CT1 18XCR BRD8 (SUTURE) ×6 IMPLANT
SUT VIC AB 0 CT1 36 (SUTURE) ×15 IMPLANT
SUT VIC AB 0 CT1 8-18 (SUTURE) ×10
SUT VIC AB 3-0 PS2 18 (SUTURE)
SUT VIC AB 3-0 PS2 18XBRD (SUTURE) IMPLANT
SUT VICRYL 0 TIES 12 18 (SUTURE) ×5 IMPLANT
SUT VICRYL 0 UR6 27IN ABS (SUTURE) ×5 IMPLANT
TOWEL OR 17X24 6PK STRL BLUE (TOWEL DISPOSABLE) ×10 IMPLANT
TRAY FOLEY CATH 14FR (SET/KITS/TRAYS/PACK) ×5 IMPLANT
TROCAR OPTI TIP 5M 100M (ENDOMECHANICALS) ×5 IMPLANT
TROCAR XCEL DIL TIP R 11M (ENDOMECHANICALS) ×5 IMPLANT
WARMER LAPAROSCOPE (MISCELLANEOUS) ×5 IMPLANT
WATER STERILE IRR 1000ML POUR (IV SOLUTION) ×2 IMPLANT

## 2014-04-15 NOTE — Anesthesia Postprocedure Evaluation (Signed)
  Anesthesia Post-op Note  Patient: Alyssa Contreras  Procedure(s) Performed: Procedure(s): LAPAROSCOPIC ASSISTED VAGINAL HYSTERECTOMY (N/A) SALPINGO OOPHORECTOMY (Bilateral) EXCISION OF RIGHT LABIAL SKIN TAG (Right)  Patient Location: PACU  Anesthesia Type:General  Level of Consciousness: awake, alert  and oriented  Airway and Oxygen Therapy: Patient Spontanous Breathing and Patient connected to nasal cannula oxygen  Post-op Pain: mild  Post-op Assessment: Post-op Vital signs reviewed, Patient's Cardiovascular Status Stable, Respiratory Function Stable, Patent Airway, No signs of Nausea or vomiting and Pain level controlled  Post-op Vital Signs: Reviewed and stable  Last Vitals:  Filed Vitals:   04/15/14 1000  BP: 104/64  Pulse: 89  Temp:   Resp: 18    Complications: No apparent anesthesia complications

## 2014-04-15 NOTE — Addendum Note (Signed)
Addendum created 04/15/14 1640 by Flossie Dibble, CRNA   Modules edited: Notes Section   Notes Section:  File: [id number]

## 2014-04-15 NOTE — Op Note (Signed)
Alyssa Contreras, Alyssa Contreras               ACCOUNT NO.:  [id number]  MEDICAL RECORD NO.:  [id number]  LOCATION:  [facility]                          FACILITY:  [facility]  PHYSICIAN:  Verlia Kaney L. Reyansh Kushnir, M.D.DATE OF BIRTH:  09/09/1971  DATE OF PROCEDURE:  04/15/2014 DATE OF DISCHARGE:                              OPERATIVE REPORT   PREOPERATIVE DIAGNOSIS:  History of breast cancer.  POSTOPERATIVE DIAGNOSIS:  History of breast cancer.  PROCEDURE:  LAVH and BSO, and removal of right labial skin tag.  SURGEON:  Azarie Coriz L. Helane Rima, MD  ASSISTANT:  Dr. Lynnette Caffey.  ANESTHESIA:  General.  EBL:  150 mL.  URINE OUTPUT:  250 mL of clear urine.  DESCRIPTION OF PROCEDURE:  Patient was taken to the operating room.  She was intubated.  She was prepped and draped.  A Foley catheter was inserted.  A small infraumbilical incision was made and the Veress needle was inserted and pneumoperitoneum was performed.  The Veress needle was removed and an 11 mm trocar was inserted with excellent visualization and the laparoscope was introduced into the abdominal cavity.  No intestinal injury or bleeding was noted.  Upon entry into the abdominal cavity, we inspected the pelvis, the uterus and adnexa were normal.  The patient was gently placed in Trendelenburg position. The 5 mm trocar was inserted suprapubically under direct visualization. Attention was turned to the adnexa where a grasper was used to grasp the right ovary and tube.  I identified the right infundibulopelvic ligament.  The ureter was well below our clamp.  We placed the EnSeal across the IP ligament, cauterized and cut that and carried that down to the round ligament, this was done with excellent hemostasis.  This was done in an identical fashion on the left side as well.  We then went down to the vagina after pneumoperitoneum was released.  I removed the small little skin tag on the right labia and sent that to Pathology, placed a weighted speculum in the  vagina, made a circumferential incision around the cervix, entered the posterior cul-de-sac using Mayo scissors, entered the anterior cul-de-sac using Metzenbaum scissors.  A weighted speculum was placed in the cul-de-sac.  The uterosacral cardinal ligaments were clamped on either side using Haney clamps.  Each pedicle was clamped, cut, and suture ligated using 0 Vicryl suture.  We walked our way up the broad ligament.  Each pedicle was clamped, cut, and suture ligated using 0 Vicryl suture.  Once we reached the level of the fundus, the uterus was easily retroflexed.  The remainder of the broad ligament clamped on either side.  The specimen was removed and identified the cervix, uterus, fallopian tubes, and ovaries.  The pedicles were then secured using suture ligature of 0 Vicryl suture. The angle stitches were placed at 3 and 9 o'clock using 0 Vicryl suture. The posterior cuff was closed with a running locked stitch using 0 Vicryl suture.  The cuff was then closed completely using 0 Vicryl suture, using anterior to posterior approach in a running locked fashion.  The attention was then turned back to the abdomen.  We re- insufflated.  I irrigated the pelvis.  There was no area of  oozing because of patient's history of bleeding with her mastectomy.  I elected to place a large piece of Surgicel at the vaginal cuff to hopefully minimize any chance of the cuff hematoma postop.  It was very hemostatic and was gently placed it across the cuff.  I then injected the remainder of the local in the cul-de-sac.  The pneumoperitoneum was released.  The trocars were removed.  The skin incisions were closed with 3-0 Vicryl interrupted.  A pressure dressing was applied to each abdominal incision site because of the history of the easy bleeding with her mastectomy. All sponge, lap, and instrument counts were correct x2.  Patient was extubated and went to recovery room in stable  condition.     Aileana Hodder L. Helane Rima, M.D.     Nevin Bloodgood  D:  04/15/2014  T:  04/15/2014  Job:  [id number]

## 2014-04-15 NOTE — Progress Notes (Signed)
History and Physical on the chart. No significant changes Will proceed with LAVH and BSO Removal of right labial skin tag Consent on chart.

## 2014-04-15 NOTE — Anesthesia Postprocedure Evaluation (Signed)
Anesthesia Post Note  Patient: Alyssa Contreras  Procedure(s) Performed: Procedure(s): LAPAROSCOPIC ASSISTED VAGINAL HYSTERECTOMY (N/A) SALPINGO OOPHORECTOMY (Bilateral) EXCISION OF RIGHT LABIAL SKIN TAG (Right)  Anesthesia type: General  Patient location: [facility]  Post pain: Pain level controlled  Post assessment: Post-op Vital signs reviewed  Last Vitals: BP 93/44  Pulse 69  Temp(Src) 36.4 C (Oral)  Resp 14  Ht 5\' 5"  (1.651 m)  Wt 118 lb (53.524 kg)  BMI 19.64 kg/m2  SpO2 96%  Post vital signs: Reviewed  Level of consciousness: awake  Complications: No apparent anesthesia complications

## 2014-04-15 NOTE — Transfer of Care (Signed)
Immediate Anesthesia Transfer of Care Note  Patient: Alyssa Contreras  Procedure(s) Performed: Procedure(s): LAPAROSCOPIC ASSISTED VAGINAL HYSTERECTOMY (N/A) SALPINGO OOPHORECTOMY (Bilateral) EXCISION OF RIGHT LABIAL SKIN TAG (Right)  Patient Location: PACU  Anesthesia Type:General  Level of Consciousness: awake, alert  and oriented  Airway & Oxygen Therapy: Patient Spontanous Breathing and Patient connected to nasal cannula oxygen  Post-op Assessment: Report given to PACU RN, Post -op Vital signs reviewed and stable and Patient moving all extremities X 4  Post vital signs: Reviewed and stable  Complications: No apparent anesthesia complications

## 2014-04-15 NOTE — Brief Op Note (Signed)
04/15/2014  9:03 AM  PATIENT:  Loralyn Freshwater  43 y.o. female  PRE-OPERATIVE DIAGNOSIS:  history of breast cancer, genetic mutation  POST-OPERATIVE DIAGNOSIS:  history of breast cancer, genetic mutation  PROCEDURE:  Procedure(s): LAPAROSCOPIC ASSISTED VAGINAL HYSTERECTOMY (N/A) SALPINGO OOPHORECTOMY (Bilateral) EXCISION OF RIGHT LABIAL SKIN TAG (Right)  SURGEON:  Surgeon(s) and Role:    * Cyril Mourning, MD - Primary    * Linda Hedges, DO - Assisting  PHYSICIAN ASSISTANT:   ASSISTANTS: none   ANESTHESIA:   general  EBL:  Total I/O In: 1000 [I.V.:1000] Out: 550 [Urine:400; Blood:150]  BLOOD ADMINISTERED:none  DRAINS: Urinary Catheter (Foley)   LOCAL MEDICATIONS USED:  lidocaine  SPECIMEN:  Source of Specimen:  uterus, cervix, tubes and ovaries  DISPOSITION OF SPECIMEN:  PATHOLOGY  COUNTS:  YES  TOURNIQUET:  * No tourniquets in log *  DICTATION: .Other Dictation: Dictation Number [id number]  PLAN OF CARE: Admit for overnight observation  PATIENT DISPOSITION:  PACU - hemodynamically stable.   Delay start of Pharmacological VTE agent (>24hrs) due to surgical blood loss or risk of bleeding: not applicable

## 2014-04-15 NOTE — Addendum Note (Signed)
Addendum created 04/15/14 1041 by Billie Lade, CRNA   Modules edited: Anesthesia Flowsheet

## 2014-04-16 ENCOUNTER — Encounter (HOSPITAL_COMMUNITY): Payer: Self-pay | Admitting: Obstetrics and Gynecology

## 2014-04-16 LAB — BASIC METABOLIC PANEL
BUN: 11 mg/dL (ref 6–23)
CO2: 28 mEq/L (ref 19–32)
Calcium: 8.7 mg/dL (ref 8.4–10.5)
Chloride: 102 mEq/L (ref 96–112)
Creatinine, Ser: 0.59 mg/dL (ref 0.50–1.10)
Glucose, Bld: 173 mg/dL — ABNORMAL HIGH (ref 70–99)
POTASSIUM: 4.5 meq/L (ref 3.7–5.3)
SODIUM: 139 meq/L (ref 137–147)

## 2014-04-16 LAB — CBC
HCT: 32.6 % — ABNORMAL LOW (ref 36.0–46.0)
Hemoglobin: 10.8 g/dL — ABNORMAL LOW (ref 12.0–15.0)
MCH: 29.5 pg (ref 26.0–34.0)
MCHC: 33.4 g/dL (ref 30.0–36.0)
MCV: 88.3 fL (ref 78.0–100.0)
PLATELETS: 166 10*3/uL (ref 150–400)
RBC: 3.69 MIL/uL — AB (ref 3.87–5.11)
RDW: 15.4 % (ref 11.5–15.5)
WBC: 8.6 10*3/uL (ref 4.0–10.5)

## 2014-04-16 MED ORDER — LETROZOLE 2.5 MG PO TABS
2.5000 mg | ORAL_TABLET | Freq: Every day | ORAL | Status: DC
  Administered 2014-04-16: 2.5 mg via ORAL
  Filled 2014-04-16: qty 1

## 2014-04-16 MED ORDER — BISACODYL 10 MG RE SUPP
10.0000 mg | Freq: Every day | RECTAL | Status: DC | PRN
  Administered 2014-04-16: 10 mg via RECTAL
  Filled 2014-04-16: qty 1

## 2014-04-16 MED ORDER — HYDROMORPHONE HCL 2 MG PO TABS
2.0000 mg | ORAL_TABLET | ORAL | Status: DC | PRN
  Administered 2014-04-16: 2 mg via ORAL
  Filled 2014-04-16: qty 1

## 2014-04-16 MED ORDER — IBUPROFEN 600 MG PO TABS
600.0000 mg | ORAL_TABLET | Freq: Four times a day (QID) | ORAL | Status: DC | PRN
  Administered 2014-04-16: 600 mg via ORAL

## 2014-04-16 MED ORDER — HYDROCODONE-ACETAMINOPHEN 5-325 MG PO TABS
1.0000 | ORAL_TABLET | ORAL | Status: DC | PRN
  Administered 2014-04-16 (×2): 1 via ORAL
  Filled 2014-04-16 (×2): qty 1

## 2014-04-16 NOTE — Progress Notes (Signed)
1 Day Post-Op Procedure(s) (LRB): LAPAROSCOPIC ASSISTED VAGINAL HYSTERECTOMY (N/A) SALPINGO OOPHORECTOMY (Bilateral) EXCISION OF RIGHT LABIAL SKIN TAG (Right)  Subjective: Patient reports incisional pain, tolerating PO and no problems voiding.    Objective: I have reviewed patient's vital signs, intake and output, medications and labs.  General: alert, cooperative and appears stated age Vaginal Bleeding: none Abdomen is soft and flat and non tender  Assessment: s/p Procedure(s): LAPAROSCOPIC ASSISTED VAGINAL HYSTERECTOMY (N/A) SALPINGO OOPHORECTOMY (Bilateral) EXCISION OF RIGHT LABIAL SKIN TAG (Right): stable and progressing well  Plan: Advance diet Encourage ambulation Advance to PO medication Discontinue IV fluids  LOS: 1 day    Cyril Mourning 04/16/2014, 7:46 AM

## 2014-04-16 NOTE — Progress Notes (Signed)
Dr. Helane Rima MD notifed of pts condition no nausea vs output pt resting with no c/o of pain

## 2014-04-17 MED ORDER — IBUPROFEN 600 MG PO TABS: 600.0000 mg | ORAL_TABLET | Freq: Four times a day (QID) | ORAL | Status: DC | PRN

## 2014-04-17 MED ORDER — HYDROCODONE-ACETAMINOPHEN 5-325 MG PO TABS
1.0000 | ORAL_TABLET | ORAL | Status: DC | PRN
  Administered 2014-04-17: 1 via ORAL
  Filled 2014-04-17: qty 1

## 2014-04-17 MED ORDER — TRAMADOL HCL 50 MG PO TABS: 50.0000 mg | ORAL_TABLET | Freq: Four times a day (QID) | ORAL | Status: DC | PRN

## 2014-04-17 MED ORDER — LORAZEPAM 1 MG PO TABS: 1.0000 mg | ORAL_TABLET | Freq: Every evening | ORAL | Status: DC | PRN

## 2014-04-17 MED ORDER — HYDROCODONE-ACETAMINOPHEN 5-325 MG PO TABS: 1.0000 | ORAL_TABLET | ORAL | Status: DC | PRN

## 2014-04-17 NOTE — Progress Notes (Signed)
2 Days Post-Op Procedure(s) (LRB): LAPAROSCOPIC ASSISTED VAGINAL HYSTERECTOMY (N/A) SALPINGO OOPHORECTOMY (Bilateral) EXCISION OF RIGHT LABIAL SKIN TAG (Right)  Subjective: Patient reports tolerating PO, + flatus and no problems voiding.    Objective: I have reviewed patient's vital signs, intake and output, medications and labs.  General: alert, cooperative and appears stated age Vaginal Bleeding: none Abdomen is soft flat and non tender  Assessment: s/p Procedure(s): LAPAROSCOPIC ASSISTED VAGINAL HYSTERECTOMY (N/A) SALPINGO OOPHORECTOMY (Bilateral) EXCISION OF RIGHT LABIAL SKIN TAG (Right): stable, progressing well and tolerating diet  Plan: Advance diet Discharge home  LOS: 2 days    Cyril Mourning 04/17/2014, 8:48 AM

## 2014-04-17 NOTE — Discharge Summary (Signed)
Admission Diagnosis: History of Breast Cancer  Discharge Diagnosis: Same  Hospital Course: 43 year old G 2 P 2 presented for LAVH and BSO. On POD #1 she was still experiencing some nausea and gas pain and as a result I kept her for an additional night. By this am, she was ambulating, voiding and had good pain control. She was discharged home in good condition. Post op hemoglobin was 10.8 Discharged home with Ativan, Ibuprofen, and Norco. Follow up in 1 week Discharge precautions given.

## 2014-05-01 ENCOUNTER — Telehealth (HOSPITAL_COMMUNITY): Payer: Self-pay | Admitting: Cardiology

## 2014-05-01 ENCOUNTER — Encounter (HOSPITAL_COMMUNITY): Payer: Self-pay | Admitting: Cardiology

## 2014-05-01 NOTE — Telephone Encounter (Signed)
Attempting to schedule follow up with ECHO I have been unable to reach this patient by phone.  A letter is being sent to the last known home address.

## 2014-06-09 ENCOUNTER — Encounter (HOSPITAL_COMMUNITY): Payer: BC Managed Care – PPO

## 2014-06-09 ENCOUNTER — Ambulatory Visit (HOSPITAL_COMMUNITY): Payer: BC Managed Care – PPO

## 2014-06-11 ENCOUNTER — Telehealth (HOSPITAL_COMMUNITY): Payer: Self-pay | Admitting: Vascular Surgery

## 2014-07-01 ENCOUNTER — Other Ambulatory Visit (HOSPITAL_COMMUNITY): Payer: BC Managed Care – PPO

## 2014-07-01 ENCOUNTER — Encounter (HOSPITAL_COMMUNITY): Payer: BC Managed Care – PPO

## 2014-07-02 ENCOUNTER — Ambulatory Visit (HOSPITAL_COMMUNITY)
Admission: RE | Admit: 2014-07-02 | Discharge: 2014-07-02 | Disposition: A | Discharge status: Home / Self Care | Payer: BC Managed Care – PPO | Source: Ambulatory Visit | Attending: Internal Medicine | Admitting: Internal Medicine

## 2014-07-02 ENCOUNTER — Ambulatory Visit (HOSPITAL_BASED_OUTPATIENT_CLINIC_OR_DEPARTMENT_OTHER)
Admission: RE | Admit: 2014-07-02 | Discharge: 2014-07-02 | Disposition: A | Discharge status: Home / Self Care | Payer: BC Managed Care – PPO | Source: Ambulatory Visit | Attending: Cardiology | Admitting: Cardiology

## 2014-07-02 VITALS — BP 108/66 | HR 72 | Wt 119.5 lb

## 2014-07-02 DIAGNOSIS — N63 Unspecified lump in unspecified breast: Secondary | ICD-10-CM | POA: Insufficient documentation

## 2014-07-02 DIAGNOSIS — N6310 Unspecified lump in the right breast, unspecified quadrant: Secondary | ICD-10-CM

## 2014-07-02 DIAGNOSIS — C50919 Malignant neoplasm of unspecified site of unspecified female breast: Secondary | ICD-10-CM

## 2014-07-02 NOTE — Patient Instructions (Signed)
Follow up as needed

## 2014-07-02 NOTE — Progress Notes (Signed)
Patient ID: Alyssa Contreras, female   DOB: November 05, 1971, 43 y.o.   MRN: [id number] General Surgeon: Dr Marlou Starks Oncologist: Dr Philipp Ovens.   HPI:   Alyssa Contreras is a 43 year old very active woman with recently diagnosed triple-positive R sided breast cancer (April 2014). Stage 1. S/P bilateral mastectomies with tissue expanders. S/P [facility] 03/2014   She has been seeing Dr. Philipp Ovens at [facility] for her breast CA and referred to the cardio-oncology program for monitoring. She ihas been treated with carbolatinum, taxotere, perjeta which started on Apr 23, 2013 and she completed 5 cycles. She continues on perjeta and herceptin every 3 weeks. Plan to complete Herceptin Apr 29 2014.   She returns for follow up. Complains of fatigue but improving. Denies SOB/PND/Orthopnea. Walks 2 miles 5 days a week. Working full time.   ECHOES 06/23/13 EF 60%  lateral s' 11.4 10/07/13 EF 55%  lateral S' 12.1 Global Strain -16% 07/02/14 EF Lateral S' 12.8 Global Strain - 16%    Past Medical History  Diagnosis Date  . Skin tags, anus or rectum   . Hemorrhoids   . Intraductal papilloma   . Nipple discharge   . Breast pain in female   . Bleeding from the nose   . PONV (postoperative nausea and vomiting)   . Hypothyroidism   . Anxiety   . Insomnia   . GERD (gastroesophageal reflux disease)     occasional -no meds - diet controlled  . Arthritis     shoulder  . Cancer 03/26/13    breast cancer  . Anemia     Hx - 07/2013  . History of blood transfusion 07/2013    [facility] - 2 units transfused    Current Outpatient Prescriptions  Medication Sig Dispense Refill  . doxylamine, Sleep, (UNISOM) 25 MG tablet Take 25-50 mg by mouth at bedtime as needed for sleep.      Marland Kitchen HYDROcodone-acetaminophen (NORCO/VICODIN) 5-325 MG per tablet Take 1-2 tablets by mouth every 4 (four) hours as needed for moderate pain.  30 tablet  0  . ibuprofen (ADVIL,MOTRIN) 600 MG tablet Take 1 tablet (600 mg total) by mouth every 6 (six) hours as needed  for moderate pain.  60 tablet  0  . letrozole (FEMARA) 2.5 MG tablet Take 2.5 mg by mouth daily.      Marland Kitchen LORazepam (ATIVAN) 1 MG tablet Take 1 tablet (1 mg total) by mouth at bedtime as needed for anxiety.  60 tablet  0  . Multiple Vitamin (MULTIVITAMIN WITH MINERALS) TABS tablet Take 1 tablet by mouth daily.      . sertraline (ZOLOFT) 100 MG tablet Take 100 mg by mouth daily.       Marland Kitchen thyroid (ARMOUR) 60 MG tablet Take 120 mg by mouth daily before breakfast.      . traMADol (ULTRAM) 50 MG tablet Take 1 tablet (50 mg total) by mouth every 6 (six) hours as needed for moderate pain.  30 tablet  0   No current facility-administered medications for this encounter.     Allergies  Allergen Reactions  . Metoclopramide     Feels skin crawls  . Vancomycin     Red man's syndrome -- but has received vancomycin slowly for MRSA and tolerated it this way    History   Social History  . Marital Status: Married    Spouse Name: N/A    Number of Children: N/A  . Years of Education: N/A   Occupational History  . Not  on file.   Social History Main Topics  . Smoking status: Former Smoker -- 0.25 packs/day for 5 years    Types: Cigarettes  . Smokeless tobacco: Never Used     Comment: Patient quit smoking 20 yrs asgo  . Alcohol Use: Yes     Comment: social  . Drug Use: No  . Sexual Activity: Yes    Birth Control/ Protection: Surgical   Other Topics Concern  . Not on file   Social History Narrative  . No narrative on file    Family History  Problem Relation Age of Onset  . Hypertension Mother   . Stroke Mother   . Cirrhosis Mother   . Breast cancer Maternal Grandmother   . Breast cancer Maternal Aunt   . Aneurysm Father     PHYSICAL EXAM: There were no vitals filed for this visit. General:  Well/fit appearing. No respiratory difficulty HEENT: normal Neck: supple. no JVD. Carotids 2+ bilat; no bruits. No lymphadenopathy or thryomegaly appreciated. Cor: PMI nondisplaced. Regular  rate & rhythm. No rubs, gallops or murmurs. Lungs: clear Abdomen: soft, nontender, nondistended. No hepatosplenomegaly. No bruits or masses. Good bowel sounds. Extremities: no cyanosis, clubbing, rash, edema Neuro: alert & oriented x 3, cranial nerves grossly intact. moves all 4 extremities w/o difficulty. Affect pleasant.    No results found for this or any previous visit (from the past 24 hour(s)). No results found.   ASSESSMENT & PLAN:   1. Breast Cancer. Completed Herceptin April 2015. .  Dr Aundra Dubin reviewed and discussed ECHO. EF, strain, and lateral S' stable.  Follow up as needed.   CLEGG,AMY NP-C  2:25 PM  Patient seen with NP, agree with the above note.  Patient has completed Herceptin.  No dyspnea or chest pain.  Echo reviewed today.  EF, strain, lateral s' are all stable compared to the past.  No further echoes needed.  She can followup here as needed.   Loralie Champagne 07/03/2014

## 2014-09-08 ENCOUNTER — Other Ambulatory Visit: Payer: Self-pay | Admitting: Neurological Surgery

## 2014-09-08 DIAGNOSIS — M542 Cervicalgia: Secondary | ICD-10-CM

## 2014-09-16 ENCOUNTER — Other Ambulatory Visit: Payer: Self-pay | Admitting: Neurological Surgery

## 2014-09-16 ENCOUNTER — Ambulatory Visit
Admission: RE | Admit: 2014-09-16 | Discharge: 2014-09-16 | Disposition: A | Discharge status: Home / Self Care | Payer: BC Managed Care – PPO | Source: Ambulatory Visit | Attending: Neurological Surgery | Admitting: Neurological Surgery

## 2014-09-16 DIAGNOSIS — M545 Low back pain, unspecified: Secondary | ICD-10-CM

## 2014-09-16 DIAGNOSIS — M542 Cervicalgia: Secondary | ICD-10-CM

## 2014-09-16 DIAGNOSIS — M5489 Other dorsalgia: Secondary | ICD-10-CM

## 2014-09-21 ENCOUNTER — Other Ambulatory Visit: Payer: BC Managed Care – PPO

## 2014-09-21 NOTE — Telephone Encounter (Signed)
Encounter open error 

## 2014-09-22 ENCOUNTER — Ambulatory Visit
Admission: RE | Admit: 2014-09-22 | Discharge: 2014-09-22 | Disposition: A | Discharge status: Home / Self Care | Payer: BC Managed Care – PPO | Source: Ambulatory Visit | Attending: Neurological Surgery | Admitting: Neurological Surgery

## 2014-09-22 ENCOUNTER — Other Ambulatory Visit: Payer: BC Managed Care – PPO

## 2014-09-22 DIAGNOSIS — M5489 Other dorsalgia: Secondary | ICD-10-CM

## 2014-09-22 DIAGNOSIS — M542 Cervicalgia: Secondary | ICD-10-CM

## 2014-09-22 DIAGNOSIS — M545 Low back pain, unspecified: Secondary | ICD-10-CM

## 2014-09-22 MED ORDER — GADOBENATE DIMEGLUMINE 529 MG/ML IV SOLN
10.0000 mL | Freq: Once | INTRAVENOUS | Status: AC | PRN
  Administered 2014-09-22: 10 mL via INTRAVENOUS

## 2014-09-24 ENCOUNTER — Other Ambulatory Visit: Payer: BC Managed Care – PPO

## 2014-09-24 ENCOUNTER — Ambulatory Visit
Admission: RE | Admit: 2014-09-24 | Discharge: 2014-09-24 | Disposition: A | Discharge status: Home / Self Care | Payer: BC Managed Care – PPO | Source: Ambulatory Visit | Attending: Neurological Surgery | Admitting: Neurological Surgery

## 2014-09-24 MED ORDER — GADOBENATE DIMEGLUMINE 529 MG/ML IV SOLN
10.0000 mL | Freq: Once | INTRAVENOUS | Status: AC | PRN
  Administered 2014-09-24: 10 mL via INTRAVENOUS

## 2015-02-23 ENCOUNTER — Encounter (HOSPITAL_COMMUNITY): Payer: Self-pay | Admitting: *Deleted

## 2015-02-23 ENCOUNTER — Inpatient Hospital Stay (HOSPITAL_COMMUNITY)
Admission: AD | Admit: 2015-02-23 | Discharge: 2015-02-23 | Disposition: A | Discharge status: Home / Self Care | Payer: BLUE CROSS/BLUE SHIELD | Source: Ambulatory Visit | Attending: Obstetrics and Gynecology | Admitting: Obstetrics and Gynecology

## 2015-02-23 DIAGNOSIS — Z853 Personal history of malignant neoplasm of breast: Secondary | ICD-10-CM | POA: Insufficient documentation

## 2015-02-23 DIAGNOSIS — K5900 Constipation, unspecified: Secondary | ICD-10-CM | POA: Diagnosis not present

## 2015-02-23 DIAGNOSIS — Z87891 Personal history of nicotine dependence: Secondary | ICD-10-CM | POA: Diagnosis not present

## 2015-02-23 LAB — URINALYSIS, ROUTINE W REFLEX MICROSCOPIC
Bilirubin Urine: NEGATIVE
Glucose, UA: NEGATIVE mg/dL
Hgb urine dipstick: NEGATIVE
Ketones, ur: NEGATIVE mg/dL
Leukocytes, UA: NEGATIVE
Nitrite: NEGATIVE
Protein, ur: NEGATIVE mg/dL
Specific Gravity, Urine: 1.015 (ref 1.005–1.030)
Urobilinogen, UA: 0.2 mg/dL (ref 0.0–1.0)
pH: 8.5 — ABNORMAL HIGH (ref 5.0–8.0)

## 2015-02-23 NOTE — Discharge Instructions (Signed)

## 2015-02-23 NOTE — Progress Notes (Signed)
Allie Dimmer PA in earlier to discuss d/c plan with pt. Written and verbal d/c instructions given and understanding voiced

## 2015-02-23 NOTE — MAU Provider Note (Signed)
History     CSN: [id number]  Arrival date and time: 02/23/15 5643   First Provider Initiated Contact with Patient 02/23/15 0901      Chief Complaint  Patient presents with  . Constipation   HPI Alyssa Contreras 44 y.o. G2P2000 nonpregnant female presents for evaluation of MAU.  No bowel movement since 2/20.  She did have a dietary change just prior to this - with more healthy foods.  She has tried 14 different products including Amitiza and mag citrate most recently.  Dr. Helane Rima yesterday advised her to come to MAU for soap suds enema.  She has associated fatigue, nausea, abdominal bloating, low back pain and poor appetite.   Denies vaginal bleeding, dysuria, fever.   OB History    Gravida Para Term Preterm AB TAB SAB Ectopic Multiple Living   2 2 2              Past Medical History  Diagnosis Date  . Skin tags, anus or rectum   . Hemorrhoids   . Intraductal papilloma   . Nipple discharge   . Breast pain in female   . Bleeding from the nose   . PONV (postoperative nausea and vomiting)   . Hypothyroidism   . Anxiety   . Insomnia   . GERD (gastroesophageal reflux disease)     occasional -no meds - diet controlled  . Arthritis     shoulder  . Cancer 03/26/13    breast cancer  . Anemia     Hx - 07/2013  . History of blood transfusion 07/2013    [facility] - 2 units transfused    Past Surgical History  Procedure Laterality Date  . Staple hemorrhoidectomy    . Breast surgery  2005    left papilloma  . Tonsillectomy  1992  . Hernia repair  1977    double  . Breast biopsy  08/27/2006    left  . Mastectomy  08/28/2013    Breast Cancer , Sential node removal right arm  . Wisdom tooth extraction    . Eye surgery      as a child x 4  . Adenoidectomy    . Pelvic congestion syndrome      some wires in veins - left ovaries  . Essure tubal ligation    . Portacath insertion      left side - history breast cancer  . Laparoscopic assisted vaginal hysterectomy N/A 04/15/2014     Procedure: LAPAROSCOPIC ASSISTED VAGINAL HYSTERECTOMY;  Surgeon: Cyril Mourning, MD;  Location: [facility];  Service: Gynecology;  Laterality: N/A;  . Salpingoophorectomy Bilateral 04/15/2014    Procedure: SALPINGO OOPHORECTOMY;  Surgeon: Cyril Mourning, MD;  Location: [facility];  Service: Gynecology;  Laterality: Bilateral;  . Excision of skin tag Right 04/15/2014    Procedure: EXCISION OF RIGHT LABIAL SKIN TAG;  Surgeon: Cyril Mourning, MD;  Location: [facility];  Service: Gynecology;  Laterality: Right;  . Abdominal hysterectomy    . Breast reconstruction Bilateral 2014    Family History  Problem Relation Age of Onset  . Hypertension Mother   . Stroke Mother   . Cirrhosis Mother   . Breast cancer Maternal Grandmother   . Breast cancer Maternal Aunt   . Aneurysm Father     History  Substance Use Topics  . Smoking status: Former Smoker -- 0.25 packs/day for 5 years    Types: Cigarettes  . Smokeless tobacco: Never Used     Comment:  Patient quit smoking 20 yrs asgo  . Alcohol Use: Yes     Comment: social    Allergies:  Allergies  Allergen Reactions  . Metoclopramide     Feels skin crawls  . Vancomycin     Red man's syndrome -- but has received vancomycin slowly for MRSA and tolerated it this way    Prescriptions prior to admission  Medication Sig Dispense Refill Last Dose  . doxylamine, Sleep, (UNISOM) 25 MG tablet Take 25-50 mg by mouth at bedtime as needed for sleep.   Taking  . ibuprofen (ADVIL,MOTRIN) 600 MG tablet Take 1 tablet (600 mg total) by mouth every 6 (six) hours as needed for moderate pain. 60 tablet 0 Taking  . letrozole (FEMARA) 2.5 MG tablet Take 2.5 mg by mouth daily.   Taking  . LORazepam (ATIVAN) 1 MG tablet Take 1 tablet (1 mg total) by mouth at bedtime as needed for anxiety. 60 tablet 0 Taking  . Multiple Vitamin (MULTIVITAMIN WITH MINERALS) TABS tablet Take 1 tablet by mouth daily.   Taking  . sertraline (ZOLOFT) 100 MG tablet Take 100 mg by mouth  daily.    Taking  . thyroid (ARMOUR) 60 MG tablet Take 120 mg by mouth daily before breakfast.   Taking  . traMADol (ULTRAM) 50 MG tablet Take 1 tablet (50 mg total) by mouth every 6 (six) hours as needed for moderate pain. 30 tablet 0 Taking    ROS Pertinent ROS in HPI  Physical Exam   Blood pressure 110/71, pulse 72, temperature 97.5 F (36.4 C), temperature source Oral, resp. rate 18, last menstrual period 03/24/2013.  Physical Exam  Constitutional: She is oriented to person, place, and time. She appears well-developed and well-nourished. No distress.  HENT:  Head: Normocephalic and atraumatic.  Eyes: EOM are normal.  Neck: Normal range of motion.  Cardiovascular: Normal rate and regular rhythm.   Respiratory: Effort normal and breath sounds normal. No respiratory distress.  GI: Soft. Bowel sounds are normal. She exhibits distension. There is tenderness. There is no rebound and no guarding.  Musculoskeletal: Normal range of motion.  Neurological: She is alert and oriented to person, place, and time.  Skin: Skin is warm and dry.  Psychiatric: She has a normal mood and affect.    MAU Course  Procedures  Soap suds enema.  Pt notes bowel movement x 5.  Feeling much improved.  Requests discharge.  MDM Discussed with Dr. Gaetano Net.  He is in agreement for soap suds enema.  He advises for regular use of Miralax and Probiotics on discharge  Assessment and Plan  A: Constipation post chemo  P: Discharge to home Miralax regularly Probiotics Follow up in clinic prn Patient may return to MAU as needed or if her condition were to change or worsen   Paticia Stack 02/23/2015, 9:02 AM

## 2015-02-23 NOTE — MAU Note (Signed)
Pt referred to MAU by Dr Helane Rima for constipation, last BM was 2/20.  Pt states she has tried several enemas, 6 doses of miralax, ex-lax, mineral oil, magnesium citrate last night with no results.  Has abd pain, starting to have back pain.  Cancer survivor, feels her intestines have been affected by chemo.

## 2017-01-29 DIAGNOSIS — N651 Disproportion of reconstructed breast: Secondary | ICD-10-CM | POA: Diagnosis not present

## 2017-01-29 DIAGNOSIS — N650 Deformity of reconstructed breast: Secondary | ICD-10-CM | POA: Diagnosis not present

## 2017-01-29 DIAGNOSIS — Z6820 Body mass index (BMI) 20.0-20.9, adult: Secondary | ICD-10-CM | POA: Diagnosis not present

## 2017-03-05 DIAGNOSIS — C50919 Malignant neoplasm of unspecified site of unspecified female breast: Secondary | ICD-10-CM | POA: Diagnosis not present

## 2017-03-05 DIAGNOSIS — Z170 Estrogen receptor positive status [ER+]: Secondary | ICD-10-CM | POA: Diagnosis not present

## 2017-03-05 DIAGNOSIS — C50911 Malignant neoplasm of unspecified site of right female breast: Secondary | ICD-10-CM | POA: Diagnosis not present

## 2017-03-16 DIAGNOSIS — N650 Deformity of reconstructed breast: Secondary | ICD-10-CM | POA: Diagnosis not present

## 2017-03-16 DIAGNOSIS — Z421 Encounter for breast reconstruction following mastectomy: Secondary | ICD-10-CM | POA: Diagnosis not present

## 2017-03-16 DIAGNOSIS — M8580 Other specified disorders of bone density and structure, unspecified site: Secondary | ICD-10-CM | POA: Diagnosis not present

## 2017-03-16 DIAGNOSIS — Z7983 Long term (current) use of bisphosphonates: Secondary | ICD-10-CM | POA: Diagnosis not present

## 2017-03-16 DIAGNOSIS — Z853 Personal history of malignant neoplasm of breast: Secondary | ICD-10-CM | POA: Diagnosis not present

## 2017-03-16 DIAGNOSIS — E039 Hypothyroidism, unspecified: Secondary | ICD-10-CM | POA: Diagnosis not present

## 2017-03-16 DIAGNOSIS — Z79811 Long term (current) use of aromatase inhibitors: Secondary | ICD-10-CM | POA: Diagnosis not present

## 2017-03-16 DIAGNOSIS — F419 Anxiety disorder, unspecified: Secondary | ICD-10-CM | POA: Diagnosis not present

## 2017-03-16 DIAGNOSIS — Z9013 Acquired absence of bilateral breasts and nipples: Secondary | ICD-10-CM | POA: Diagnosis not present

## 2017-03-17 DIAGNOSIS — F419 Anxiety disorder, unspecified: Secondary | ICD-10-CM | POA: Diagnosis not present

## 2017-03-17 DIAGNOSIS — E039 Hypothyroidism, unspecified: Secondary | ICD-10-CM | POA: Diagnosis not present

## 2017-03-17 DIAGNOSIS — Z853 Personal history of malignant neoplasm of breast: Secondary | ICD-10-CM | POA: Diagnosis not present

## 2017-03-17 DIAGNOSIS — Z79811 Long term (current) use of aromatase inhibitors: Secondary | ICD-10-CM | POA: Diagnosis not present

## 2017-03-17 DIAGNOSIS — Z421 Encounter for breast reconstruction following mastectomy: Secondary | ICD-10-CM | POA: Diagnosis not present

## 2017-03-17 DIAGNOSIS — Z7983 Long term (current) use of bisphosphonates: Secondary | ICD-10-CM | POA: Diagnosis not present

## 2017-03-17 DIAGNOSIS — M8580 Other specified disorders of bone density and structure, unspecified site: Secondary | ICD-10-CM | POA: Diagnosis not present

## 2017-04-03 DIAGNOSIS — H0015 Chalazion left lower eyelid: Secondary | ICD-10-CM | POA: Diagnosis not present

## 2017-04-03 DIAGNOSIS — H04123 Dry eye syndrome of bilateral lacrimal glands: Secondary | ICD-10-CM | POA: Diagnosis not present

## 2017-04-03 DIAGNOSIS — H5213 Myopia, bilateral: Secondary | ICD-10-CM | POA: Diagnosis not present

## 2017-04-03 DIAGNOSIS — H524 Presbyopia: Secondary | ICD-10-CM | POA: Diagnosis not present

## 2017-04-04 DIAGNOSIS — Z111 Encounter for screening for respiratory tuberculosis: Secondary | ICD-10-CM | POA: Diagnosis not present

## 2017-04-05 DIAGNOSIS — Z452 Encounter for adjustment and management of vascular access device: Secondary | ICD-10-CM | POA: Diagnosis not present

## 2017-04-05 DIAGNOSIS — E039 Hypothyroidism, unspecified: Secondary | ICD-10-CM | POA: Diagnosis not present

## 2017-04-05 DIAGNOSIS — Z853 Personal history of malignant neoplasm of breast: Secondary | ICD-10-CM | POA: Diagnosis not present

## 2017-04-05 DIAGNOSIS — N6459 Other signs and symptoms in breast: Secondary | ICD-10-CM | POA: Diagnosis not present

## 2017-04-05 DIAGNOSIS — T814XXA Infection following a procedure, initial encounter: Secondary | ICD-10-CM | POA: Diagnosis not present

## 2017-04-05 DIAGNOSIS — Z792 Long term (current) use of antibiotics: Secondary | ICD-10-CM | POA: Diagnosis not present

## 2017-04-05 DIAGNOSIS — N61 Mastitis without abscess: Secondary | ICD-10-CM | POA: Diagnosis not present

## 2017-04-05 DIAGNOSIS — R600 Localized edema: Secondary | ICD-10-CM | POA: Diagnosis not present

## 2017-04-05 DIAGNOSIS — F419 Anxiety disorder, unspecified: Secondary | ICD-10-CM | POA: Diagnosis not present

## 2017-04-05 DIAGNOSIS — Z9013 Acquired absence of bilateral breasts and nipples: Secondary | ICD-10-CM | POA: Diagnosis not present

## 2017-04-05 DIAGNOSIS — Z79899 Other long term (current) drug therapy: Secondary | ICD-10-CM | POA: Diagnosis not present

## 2017-04-05 DIAGNOSIS — Z79811 Long term (current) use of aromatase inhibitors: Secondary | ICD-10-CM | POA: Diagnosis not present

## 2017-04-05 DIAGNOSIS — Z888 Allergy status to other drugs, medicaments and biological substances status: Secondary | ICD-10-CM | POA: Diagnosis not present

## 2017-04-06 DIAGNOSIS — Z79811 Long term (current) use of aromatase inhibitors: Secondary | ICD-10-CM | POA: Diagnosis not present

## 2017-04-06 DIAGNOSIS — R600 Localized edema: Secondary | ICD-10-CM | POA: Diagnosis not present

## 2017-04-06 DIAGNOSIS — L03818 Cellulitis of other sites: Secondary | ICD-10-CM | POA: Diagnosis not present

## 2017-04-06 DIAGNOSIS — Z853 Personal history of malignant neoplasm of breast: Secondary | ICD-10-CM | POA: Diagnosis not present

## 2017-04-06 DIAGNOSIS — F419 Anxiety disorder, unspecified: Secondary | ICD-10-CM | POA: Diagnosis not present

## 2017-04-06 DIAGNOSIS — Z9013 Acquired absence of bilateral breasts and nipples: Secondary | ICD-10-CM | POA: Diagnosis not present

## 2017-04-06 DIAGNOSIS — E039 Hypothyroidism, unspecified: Secondary | ICD-10-CM | POA: Diagnosis not present

## 2017-04-06 DIAGNOSIS — Z79899 Other long term (current) drug therapy: Secondary | ICD-10-CM | POA: Diagnosis not present

## 2017-04-06 DIAGNOSIS — Z888 Allergy status to other drugs, medicaments and biological substances status: Secondary | ICD-10-CM | POA: Diagnosis not present

## 2017-04-06 DIAGNOSIS — N61 Mastitis without abscess: Secondary | ICD-10-CM | POA: Diagnosis not present

## 2017-04-07 DIAGNOSIS — L03818 Cellulitis of other sites: Secondary | ICD-10-CM | POA: Diagnosis not present

## 2017-04-08 DIAGNOSIS — L03818 Cellulitis of other sites: Secondary | ICD-10-CM | POA: Diagnosis not present

## 2017-04-09 DIAGNOSIS — L03818 Cellulitis of other sites: Secondary | ICD-10-CM | POA: Diagnosis not present

## 2017-04-11 DIAGNOSIS — L03818 Cellulitis of other sites: Secondary | ICD-10-CM | POA: Diagnosis not present

## 2017-04-16 DIAGNOSIS — L03818 Cellulitis of other sites: Secondary | ICD-10-CM | POA: Diagnosis not present

## 2017-04-18 DIAGNOSIS — L03818 Cellulitis of other sites: Secondary | ICD-10-CM | POA: Diagnosis not present

## 2017-04-20 DIAGNOSIS — L03818 Cellulitis of other sites: Secondary | ICD-10-CM | POA: Diagnosis not present

## 2017-04-25 DIAGNOSIS — Z9013 Acquired absence of bilateral breasts and nipples: Secondary | ICD-10-CM | POA: Diagnosis not present

## 2017-04-25 DIAGNOSIS — H04121 Dry eye syndrome of right lacrimal gland: Secondary | ICD-10-CM | POA: Diagnosis not present

## 2017-04-25 DIAGNOSIS — H04122 Dry eye syndrome of left lacrimal gland: Secondary | ICD-10-CM | POA: Diagnosis not present

## 2017-04-25 DIAGNOSIS — Z79899 Other long term (current) drug therapy: Secondary | ICD-10-CM | POA: Diagnosis not present

## 2017-04-25 DIAGNOSIS — Z800 Family history of malignant neoplasm of digestive organs: Secondary | ICD-10-CM | POA: Diagnosis not present

## 2017-04-25 DIAGNOSIS — Z853 Personal history of malignant neoplasm of breast: Secondary | ICD-10-CM | POA: Diagnosis not present

## 2017-04-25 DIAGNOSIS — Z79811 Long term (current) use of aromatase inhibitors: Secondary | ICD-10-CM | POA: Diagnosis not present

## 2017-04-25 DIAGNOSIS — F419 Anxiety disorder, unspecified: Secondary | ICD-10-CM | POA: Diagnosis not present

## 2017-04-25 DIAGNOSIS — Z6820 Body mass index (BMI) 20.0-20.9, adult: Secondary | ICD-10-CM | POA: Diagnosis not present

## 2017-04-25 DIAGNOSIS — R58 Hemorrhage, not elsewhere classified: Secondary | ICD-10-CM | POA: Diagnosis not present

## 2017-04-25 DIAGNOSIS — D680 Von Willebrand's disease: Secondary | ICD-10-CM | POA: Diagnosis not present

## 2017-04-25 DIAGNOSIS — Z881 Allergy status to other antibiotic agents status: Secondary | ICD-10-CM | POA: Diagnosis not present

## 2017-04-25 DIAGNOSIS — Z803 Family history of malignant neoplasm of breast: Secondary | ICD-10-CM | POA: Diagnosis not present

## 2017-04-25 DIAGNOSIS — Z888 Allergy status to other drugs, medicaments and biological substances status: Secondary | ICD-10-CM | POA: Diagnosis not present

## 2017-05-04 DIAGNOSIS — R238 Other skin changes: Secondary | ICD-10-CM | POA: Diagnosis not present

## 2017-05-04 DIAGNOSIS — D680 Von Willebrand's disease: Secondary | ICD-10-CM | POA: Diagnosis not present

## 2017-05-04 DIAGNOSIS — R58 Hemorrhage, not elsewhere classified: Secondary | ICD-10-CM | POA: Diagnosis not present

## 2017-05-07 DIAGNOSIS — Z0000 Encounter for general adult medical examination without abnormal findings: Secondary | ICD-10-CM | POA: Diagnosis not present

## 2017-05-07 DIAGNOSIS — E039 Hypothyroidism, unspecified: Secondary | ICD-10-CM | POA: Diagnosis not present

## 2017-05-17 DIAGNOSIS — Z0000 Encounter for general adult medical examination without abnormal findings: Secondary | ICD-10-CM | POA: Diagnosis not present

## 2017-05-17 DIAGNOSIS — Z23 Encounter for immunization: Secondary | ICD-10-CM | POA: Diagnosis not present

## 2017-05-17 DIAGNOSIS — D680 Von Willebrand's disease: Secondary | ICD-10-CM | POA: Diagnosis not present

## 2017-05-17 DIAGNOSIS — H04129 Dry eye syndrome of unspecified lacrimal gland: Secondary | ICD-10-CM | POA: Diagnosis not present

## 2017-05-17 DIAGNOSIS — C50919 Malignant neoplasm of unspecified site of unspecified female breast: Secondary | ICD-10-CM | POA: Diagnosis not present

## 2017-05-17 DIAGNOSIS — M859 Disorder of bone density and structure, unspecified: Secondary | ICD-10-CM | POA: Diagnosis not present

## 2017-05-17 DIAGNOSIS — Z1389 Encounter for screening for other disorder: Secondary | ICD-10-CM | POA: Diagnosis not present

## 2017-05-22 DIAGNOSIS — Z8739 Personal history of other diseases of the musculoskeletal system and connective tissue: Secondary | ICD-10-CM | POA: Diagnosis not present

## 2017-05-22 DIAGNOSIS — Z170 Estrogen receptor positive status [ER+]: Secondary | ICD-10-CM | POA: Diagnosis not present

## 2017-05-22 DIAGNOSIS — C50919 Malignant neoplasm of unspecified site of unspecified female breast: Secondary | ICD-10-CM | POA: Diagnosis not present

## 2017-05-28 DIAGNOSIS — Z170 Estrogen receptor positive status [ER+]: Secondary | ICD-10-CM | POA: Diagnosis not present

## 2017-05-28 DIAGNOSIS — C50912 Malignant neoplasm of unspecified site of left female breast: Secondary | ICD-10-CM | POA: Diagnosis not present

## 2017-05-28 DIAGNOSIS — C50311 Malignant neoplasm of lower-inner quadrant of right female breast: Secondary | ICD-10-CM | POA: Diagnosis not present

## 2017-05-28 DIAGNOSIS — C50919 Malignant neoplasm of unspecified site of unspecified female breast: Secondary | ICD-10-CM | POA: Diagnosis not present

## 2017-05-28 DIAGNOSIS — C50512 Malignant neoplasm of lower-outer quadrant of left female breast: Secondary | ICD-10-CM | POA: Diagnosis not present

## 2017-05-28 DIAGNOSIS — M8580 Other specified disorders of bone density and structure, unspecified site: Secondary | ICD-10-CM | POA: Diagnosis not present

## 2017-05-28 DIAGNOSIS — Z86010 Personal history of colonic polyps: Secondary | ICD-10-CM | POA: Diagnosis not present

## 2017-06-04 DIAGNOSIS — C50911 Malignant neoplasm of unspecified site of right female breast: Secondary | ICD-10-CM | POA: Diagnosis not present

## 2017-06-04 DIAGNOSIS — Z170 Estrogen receptor positive status [ER+]: Secondary | ICD-10-CM | POA: Diagnosis not present

## 2017-06-14 DIAGNOSIS — S161XXA Strain of muscle, fascia and tendon at neck level, initial encounter: Secondary | ICD-10-CM | POA: Diagnosis not present

## 2017-06-25 DIAGNOSIS — S161XXD Strain of muscle, fascia and tendon at neck level, subsequent encounter: Secondary | ICD-10-CM | POA: Diagnosis not present

## 2017-06-27 DIAGNOSIS — S161XXD Strain of muscle, fascia and tendon at neck level, subsequent encounter: Secondary | ICD-10-CM | POA: Diagnosis not present

## 2017-07-03 DIAGNOSIS — S161XXD Strain of muscle, fascia and tendon at neck level, subsequent encounter: Secondary | ICD-10-CM | POA: Diagnosis not present

## 2017-07-05 DIAGNOSIS — R51 Headache: Secondary | ICD-10-CM | POA: Diagnosis not present

## 2017-07-05 DIAGNOSIS — M542 Cervicalgia: Secondary | ICD-10-CM | POA: Diagnosis not present

## 2017-07-05 DIAGNOSIS — S0990XA Unspecified injury of head, initial encounter: Secondary | ICD-10-CM | POA: Diagnosis not present

## 2017-07-09 DIAGNOSIS — S161XXD Strain of muscle, fascia and tendon at neck level, subsequent encounter: Secondary | ICD-10-CM | POA: Diagnosis not present

## 2017-07-17 DIAGNOSIS — S161XXD Strain of muscle, fascia and tendon at neck level, subsequent encounter: Secondary | ICD-10-CM | POA: Diagnosis not present

## 2017-07-20 DIAGNOSIS — S161XXD Strain of muscle, fascia and tendon at neck level, subsequent encounter: Secondary | ICD-10-CM | POA: Diagnosis not present

## 2017-08-03 DIAGNOSIS — S161XXD Strain of muscle, fascia and tendon at neck level, subsequent encounter: Secondary | ICD-10-CM | POA: Diagnosis not present

## 2017-08-08 DIAGNOSIS — Z888 Allergy status to other drugs, medicaments and biological substances status: Secondary | ICD-10-CM | POA: Diagnosis not present

## 2017-08-08 DIAGNOSIS — Z86010 Personal history of colonic polyps: Secondary | ICD-10-CM | POA: Diagnosis not present

## 2017-08-08 DIAGNOSIS — Z79811 Long term (current) use of aromatase inhibitors: Secondary | ICD-10-CM | POA: Diagnosis not present

## 2017-08-08 DIAGNOSIS — R51 Headache: Secondary | ICD-10-CM | POA: Diagnosis not present

## 2017-08-08 DIAGNOSIS — Z9221 Personal history of antineoplastic chemotherapy: Secondary | ICD-10-CM | POA: Diagnosis not present

## 2017-08-08 DIAGNOSIS — D649 Anemia, unspecified: Secondary | ICD-10-CM | POA: Diagnosis not present

## 2017-08-08 DIAGNOSIS — Z9013 Acquired absence of bilateral breasts and nipples: Secondary | ICD-10-CM | POA: Diagnosis not present

## 2017-08-08 DIAGNOSIS — Z803 Family history of malignant neoplasm of breast: Secondary | ICD-10-CM | POA: Diagnosis not present

## 2017-08-08 DIAGNOSIS — Z801 Family history of malignant neoplasm of trachea, bronchus and lung: Secondary | ICD-10-CM | POA: Diagnosis not present

## 2017-08-08 DIAGNOSIS — E039 Hypothyroidism, unspecified: Secondary | ICD-10-CM | POA: Diagnosis not present

## 2017-08-08 DIAGNOSIS — D699 Hemorrhagic condition, unspecified: Secondary | ICD-10-CM | POA: Diagnosis not present

## 2017-08-08 DIAGNOSIS — F419 Anxiety disorder, unspecified: Secondary | ICD-10-CM | POA: Diagnosis not present

## 2017-08-08 DIAGNOSIS — D680 Von Willebrand's disease: Secondary | ICD-10-CM | POA: Diagnosis not present

## 2017-08-08 DIAGNOSIS — Z79899 Other long term (current) drug therapy: Secondary | ICD-10-CM | POA: Diagnosis not present

## 2017-08-08 DIAGNOSIS — Z6820 Body mass index (BMI) 20.0-20.9, adult: Secondary | ICD-10-CM | POA: Diagnosis not present

## 2017-08-08 DIAGNOSIS — Z9289 Personal history of other medical treatment: Secondary | ICD-10-CM | POA: Diagnosis not present

## 2017-08-08 DIAGNOSIS — Z9882 Breast implant status: Secondary | ICD-10-CM | POA: Diagnosis not present

## 2017-08-08 DIAGNOSIS — Z853 Personal history of malignant neoplasm of breast: Secondary | ICD-10-CM | POA: Diagnosis not present

## 2017-08-08 DIAGNOSIS — Z800 Family history of malignant neoplasm of digestive organs: Secondary | ICD-10-CM | POA: Diagnosis not present

## 2017-08-13 DIAGNOSIS — S161XXD Strain of muscle, fascia and tendon at neck level, subsequent encounter: Secondary | ICD-10-CM | POA: Diagnosis not present

## 2017-09-17 DIAGNOSIS — Z23 Encounter for immunization: Secondary | ICD-10-CM | POA: Diagnosis not present

## 2017-10-12 DIAGNOSIS — C50911 Malignant neoplasm of unspecified site of right female breast: Secondary | ICD-10-CM | POA: Diagnosis not present

## 2017-10-12 DIAGNOSIS — Z419 Encounter for procedure for purposes other than remedying health state, unspecified: Secondary | ICD-10-CM | POA: Diagnosis not present

## 2017-10-12 DIAGNOSIS — D691 Qualitative platelet defects: Secondary | ICD-10-CM | POA: Diagnosis not present

## 2017-10-12 DIAGNOSIS — Z01818 Encounter for other preprocedural examination: Secondary | ICD-10-CM | POA: Diagnosis not present

## 2017-10-12 DIAGNOSIS — Z01812 Encounter for preprocedural laboratory examination: Secondary | ICD-10-CM | POA: Diagnosis not present

## 2017-10-12 DIAGNOSIS — E039 Hypothyroidism, unspecified: Secondary | ICD-10-CM | POA: Diagnosis not present

## 2017-11-02 DIAGNOSIS — R0789 Other chest pain: Secondary | ICD-10-CM | POA: Diagnosis not present

## 2017-11-06 DIAGNOSIS — C50911 Malignant neoplasm of unspecified site of right female breast: Secondary | ICD-10-CM | POA: Diagnosis not present

## 2017-11-06 DIAGNOSIS — R079 Chest pain, unspecified: Secondary | ICD-10-CM | POA: Diagnosis not present

## 2017-11-06 DIAGNOSIS — C50919 Malignant neoplasm of unspecified site of unspecified female breast: Secondary | ICD-10-CM | POA: Diagnosis not present

## 2017-11-06 DIAGNOSIS — Z170 Estrogen receptor positive status [ER+]: Secondary | ICD-10-CM | POA: Diagnosis not present

## 2017-11-13 DIAGNOSIS — Z170 Estrogen receptor positive status [ER+]: Secondary | ICD-10-CM | POA: Diagnosis not present

## 2017-11-13 DIAGNOSIS — C50512 Malignant neoplasm of lower-outer quadrant of left female breast: Secondary | ICD-10-CM | POA: Diagnosis not present

## 2017-11-28 DIAGNOSIS — Y828 Other medical devices associated with adverse incidents: Secondary | ICD-10-CM | POA: Diagnosis not present

## 2017-11-28 DIAGNOSIS — C50911 Malignant neoplasm of unspecified site of right female breast: Secondary | ICD-10-CM | POA: Diagnosis not present

## 2017-11-28 DIAGNOSIS — T85848D Pain due to other internal prosthetic devices, implants and grafts, subsequent encounter: Secondary | ICD-10-CM | POA: Diagnosis not present

## 2017-11-28 DIAGNOSIS — Z170 Estrogen receptor positive status [ER+]: Secondary | ICD-10-CM | POA: Diagnosis not present

## 2017-11-28 DIAGNOSIS — M8580 Other specified disorders of bone density and structure, unspecified site: Secondary | ICD-10-CM | POA: Diagnosis not present

## 2017-11-28 DIAGNOSIS — T85848A Pain due to other internal prosthetic devices, implants and grafts, initial encounter: Secondary | ICD-10-CM | POA: Diagnosis not present

## 2017-11-29 DIAGNOSIS — Z9289 Personal history of other medical treatment: Secondary | ICD-10-CM | POA: Diagnosis not present

## 2017-11-29 DIAGNOSIS — F329 Major depressive disorder, single episode, unspecified: Secondary | ICD-10-CM | POA: Diagnosis not present

## 2017-11-29 DIAGNOSIS — F419 Anxiety disorder, unspecified: Secondary | ICD-10-CM | POA: Diagnosis not present

## 2017-11-29 DIAGNOSIS — Z9013 Acquired absence of bilateral breasts and nipples: Secondary | ICD-10-CM | POA: Diagnosis not present

## 2017-11-29 DIAGNOSIS — D689 Coagulation defect, unspecified: Secondary | ICD-10-CM | POA: Diagnosis not present

## 2017-11-29 DIAGNOSIS — E039 Hypothyroidism, unspecified: Secondary | ICD-10-CM | POA: Diagnosis not present

## 2017-11-29 DIAGNOSIS — N650 Deformity of reconstructed breast: Secondary | ICD-10-CM | POA: Diagnosis not present

## 2017-11-29 DIAGNOSIS — Z79899 Other long term (current) drug therapy: Secondary | ICD-10-CM | POA: Diagnosis not present

## 2017-11-29 DIAGNOSIS — Z853 Personal history of malignant neoplasm of breast: Secondary | ICD-10-CM | POA: Diagnosis not present

## 2017-11-29 DIAGNOSIS — Z421 Encounter for breast reconstruction following mastectomy: Secondary | ICD-10-CM | POA: Diagnosis not present

## 2017-11-29 DIAGNOSIS — Z9221 Personal history of antineoplastic chemotherapy: Secondary | ICD-10-CM | POA: Diagnosis not present

## 2017-12-17 DIAGNOSIS — R0789 Other chest pain: Secondary | ICD-10-CM | POA: Diagnosis not present

## 2017-12-17 DIAGNOSIS — N6331 Unspecified lump in axillary tail of the right breast: Secondary | ICD-10-CM | POA: Diagnosis not present

## 2017-12-17 DIAGNOSIS — C50911 Malignant neoplasm of unspecified site of right female breast: Secondary | ICD-10-CM | POA: Diagnosis not present

## 2017-12-17 DIAGNOSIS — Z79899 Other long term (current) drug therapy: Secondary | ICD-10-CM | POA: Diagnosis not present

## 2017-12-17 DIAGNOSIS — Z170 Estrogen receptor positive status [ER+]: Secondary | ICD-10-CM | POA: Diagnosis not present

## 2017-12-17 DIAGNOSIS — Z9013 Acquired absence of bilateral breasts and nipples: Secondary | ICD-10-CM | POA: Diagnosis not present

## 2017-12-17 DIAGNOSIS — C50919 Malignant neoplasm of unspecified site of unspecified female breast: Secondary | ICD-10-CM | POA: Diagnosis not present

## 2017-12-19 DIAGNOSIS — N644 Mastodynia: Secondary | ICD-10-CM | POA: Diagnosis not present

## 2017-12-31 DIAGNOSIS — D124 Benign neoplasm of descending colon: Secondary | ICD-10-CM | POA: Diagnosis not present

## 2017-12-31 DIAGNOSIS — K635 Polyp of colon: Secondary | ICD-10-CM | POA: Diagnosis not present

## 2017-12-31 DIAGNOSIS — Z1211 Encounter for screening for malignant neoplasm of colon: Secondary | ICD-10-CM | POA: Diagnosis not present

## 2017-12-31 DIAGNOSIS — D123 Benign neoplasm of transverse colon: Secondary | ICD-10-CM | POA: Diagnosis not present

## 2017-12-31 DIAGNOSIS — Z86010 Personal history of colonic polyps: Secondary | ICD-10-CM | POA: Diagnosis not present

## 2018-01-21 DIAGNOSIS — Z23 Encounter for immunization: Secondary | ICD-10-CM | POA: Diagnosis not present

## 2018-03-26 DIAGNOSIS — H0100B Unspecified blepharitis left eye, upper and lower eyelids: Secondary | ICD-10-CM | POA: Diagnosis not present

## 2018-03-26 DIAGNOSIS — H0100A Unspecified blepharitis right eye, upper and lower eyelids: Secondary | ICD-10-CM | POA: Diagnosis not present

## 2018-03-26 DIAGNOSIS — H04123 Dry eye syndrome of bilateral lacrimal glands: Secondary | ICD-10-CM | POA: Diagnosis not present

## 2018-04-03 DIAGNOSIS — Z111 Encounter for screening for respiratory tuberculosis: Secondary | ICD-10-CM | POA: Diagnosis not present

## 2018-05-10 DIAGNOSIS — H04123 Dry eye syndrome of bilateral lacrimal glands: Secondary | ICD-10-CM | POA: Diagnosis not present

## 2018-05-10 DIAGNOSIS — H0100B Unspecified blepharitis left eye, upper and lower eyelids: Secondary | ICD-10-CM | POA: Diagnosis not present

## 2018-05-10 DIAGNOSIS — H16103 Unspecified superficial keratitis, bilateral: Secondary | ICD-10-CM | POA: Diagnosis not present

## 2018-05-10 DIAGNOSIS — H5213 Myopia, bilateral: Secondary | ICD-10-CM | POA: Diagnosis not present

## 2018-05-28 DIAGNOSIS — Z08 Encounter for follow-up examination after completed treatment for malignant neoplasm: Secondary | ICD-10-CM | POA: Diagnosis not present

## 2018-05-28 DIAGNOSIS — R51 Headache: Secondary | ICD-10-CM | POA: Diagnosis not present

## 2018-05-28 DIAGNOSIS — Z853 Personal history of malignant neoplasm of breast: Secondary | ICD-10-CM | POA: Diagnosis not present

## 2018-06-24 DIAGNOSIS — Z170 Estrogen receptor positive status [ER+]: Secondary | ICD-10-CM | POA: Diagnosis not present

## 2018-06-24 DIAGNOSIS — C50919 Malignant neoplasm of unspecified site of unspecified female breast: Secondary | ICD-10-CM | POA: Diagnosis not present

## 2018-06-25 DIAGNOSIS — Z01419 Encounter for gynecological examination (general) (routine) without abnormal findings: Secondary | ICD-10-CM | POA: Diagnosis not present

## 2018-06-25 DIAGNOSIS — Z6820 Body mass index (BMI) 20.0-20.9, adult: Secondary | ICD-10-CM | POA: Diagnosis not present

## 2018-07-09 DIAGNOSIS — J322 Chronic ethmoidal sinusitis: Secondary | ICD-10-CM | POA: Diagnosis not present

## 2018-07-09 DIAGNOSIS — R51 Headache: Secondary | ICD-10-CM | POA: Diagnosis not present

## 2018-08-05 DIAGNOSIS — Z809 Family history of malignant neoplasm, unspecified: Secondary | ICD-10-CM | POA: Diagnosis not present

## 2018-08-07 DIAGNOSIS — R51 Headache: Secondary | ICD-10-CM | POA: Diagnosis not present

## 2018-08-07 DIAGNOSIS — M4722 Other spondylosis with radiculopathy, cervical region: Secondary | ICD-10-CM | POA: Diagnosis not present

## 2018-08-07 DIAGNOSIS — R410 Disorientation, unspecified: Secondary | ICD-10-CM | POA: Diagnosis not present

## 2018-08-07 DIAGNOSIS — M542 Cervicalgia: Secondary | ICD-10-CM | POA: Diagnosis not present

## 2018-08-08 DIAGNOSIS — R51 Headache: Secondary | ICD-10-CM | POA: Diagnosis not present

## 2018-08-13 DIAGNOSIS — R51 Headache: Secondary | ICD-10-CM | POA: Diagnosis not present

## 2018-08-26 DIAGNOSIS — R51 Headache: Secondary | ICD-10-CM | POA: Diagnosis not present

## 2018-08-26 DIAGNOSIS — E041 Nontoxic single thyroid nodule: Secondary | ICD-10-CM | POA: Diagnosis not present

## 2018-08-30 DIAGNOSIS — M79601 Pain in right arm: Secondary | ICD-10-CM | POA: Diagnosis not present

## 2018-08-30 DIAGNOSIS — R930 Abnormal findings on diagnostic imaging of skull and head, not elsewhere classified: Secondary | ICD-10-CM | POA: Diagnosis not present

## 2018-08-30 DIAGNOSIS — R51 Headache: Secondary | ICD-10-CM | POA: Diagnosis not present

## 2018-09-06 DIAGNOSIS — R930 Abnormal findings on diagnostic imaging of skull and head, not elsewhere classified: Secondary | ICD-10-CM | POA: Diagnosis not present

## 2018-09-17 DIAGNOSIS — Z23 Encounter for immunization: Secondary | ICD-10-CM | POA: Diagnosis not present

## 2018-10-16 DIAGNOSIS — R42 Dizziness and giddiness: Secondary | ICD-10-CM | POA: Diagnosis not present

## 2018-10-16 DIAGNOSIS — H9312 Tinnitus, left ear: Secondary | ICD-10-CM | POA: Diagnosis not present

## 2018-10-16 DIAGNOSIS — R110 Nausea: Secondary | ICD-10-CM | POA: Diagnosis not present

## 2018-10-16 DIAGNOSIS — R51 Headache: Secondary | ICD-10-CM | POA: Diagnosis not present

## 2018-10-16 DIAGNOSIS — G543 Thoracic root disorders, not elsewhere classified: Secondary | ICD-10-CM | POA: Diagnosis not present

## 2018-10-16 DIAGNOSIS — G542 Cervical root disorders, not elsewhere classified: Secondary | ICD-10-CM | POA: Diagnosis not present

## 2018-10-17 DIAGNOSIS — M542 Cervicalgia: Secondary | ICD-10-CM | POA: Diagnosis not present

## 2018-10-17 DIAGNOSIS — R51 Headache: Secondary | ICD-10-CM | POA: Diagnosis not present

## 2018-11-18 DIAGNOSIS — C50919 Malignant neoplasm of unspecified site of unspecified female breast: Secondary | ICD-10-CM | POA: Diagnosis not present

## 2018-11-18 DIAGNOSIS — Z170 Estrogen receptor positive status [ER+]: Secondary | ICD-10-CM | POA: Diagnosis not present

## 2018-11-27 DIAGNOSIS — R51 Headache: Secondary | ICD-10-CM | POA: Diagnosis not present

## 2018-11-27 DIAGNOSIS — C50919 Malignant neoplasm of unspecified site of unspecified female breast: Secondary | ICD-10-CM | POA: Diagnosis not present

## 2018-11-27 DIAGNOSIS — Z170 Estrogen receptor positive status [ER+]: Secondary | ICD-10-CM | POA: Diagnosis not present

## 2018-11-27 DIAGNOSIS — C50911 Malignant neoplasm of unspecified site of right female breast: Secondary | ICD-10-CM | POA: Diagnosis not present

## 2018-11-27 DIAGNOSIS — M4712 Other spondylosis with myelopathy, cervical region: Secondary | ICD-10-CM | POA: Diagnosis not present

## 2018-11-27 DIAGNOSIS — M5412 Radiculopathy, cervical region: Secondary | ICD-10-CM | POA: Diagnosis not present

## 2018-11-27 DIAGNOSIS — M8580 Other specified disorders of bone density and structure, unspecified site: Secondary | ICD-10-CM | POA: Diagnosis not present

## 2018-11-27 DIAGNOSIS — Z79899 Other long term (current) drug therapy: Secondary | ICD-10-CM | POA: Diagnosis not present

## 2019-01-15 DIAGNOSIS — M5030 Other cervical disc degeneration, unspecified cervical region: Secondary | ICD-10-CM | POA: Diagnosis not present

## 2019-01-15 DIAGNOSIS — M5412 Radiculopathy, cervical region: Secondary | ICD-10-CM | POA: Diagnosis not present

## 2019-01-15 DIAGNOSIS — M4712 Other spondylosis with myelopathy, cervical region: Secondary | ICD-10-CM | POA: Diagnosis not present

## 2019-01-15 DIAGNOSIS — M4050 Lordosis, unspecified, site unspecified: Secondary | ICD-10-CM | POA: Diagnosis not present

## 2019-01-20 DIAGNOSIS — M5012 Mid-cervical disc disorder, unspecified level: Secondary | ICD-10-CM | POA: Diagnosis not present

## 2019-01-20 DIAGNOSIS — D680 Von Willebrand's disease: Secondary | ICD-10-CM | POA: Diagnosis not present

## 2019-01-20 DIAGNOSIS — E039 Hypothyroidism, unspecified: Secondary | ICD-10-CM | POA: Diagnosis not present

## 2019-01-20 DIAGNOSIS — Z01818 Encounter for other preprocedural examination: Secondary | ICD-10-CM | POA: Diagnosis not present

## 2019-01-20 DIAGNOSIS — Z853 Personal history of malignant neoplasm of breast: Secondary | ICD-10-CM | POA: Diagnosis not present

## 2019-01-30 DIAGNOSIS — M4712 Other spondylosis with myelopathy, cervical region: Secondary | ICD-10-CM | POA: Diagnosis not present

## 2019-01-30 DIAGNOSIS — M50021 Cervical disc disorder at C4-C5 level with myelopathy: Secondary | ICD-10-CM | POA: Diagnosis not present

## 2019-01-30 DIAGNOSIS — M50122 Cervical disc disorder at C5-C6 level with radiculopathy: Secondary | ICD-10-CM | POA: Diagnosis not present

## 2019-01-30 DIAGNOSIS — M4722 Other spondylosis with radiculopathy, cervical region: Secondary | ICD-10-CM | POA: Diagnosis not present

## 2019-01-30 DIAGNOSIS — E039 Hypothyroidism, unspecified: Secondary | ICD-10-CM | POA: Diagnosis not present

## 2019-01-30 DIAGNOSIS — R51 Headache: Secondary | ICD-10-CM | POA: Diagnosis not present

## 2019-01-30 DIAGNOSIS — M50121 Cervical disc disorder at C4-C5 level with radiculopathy: Secondary | ICD-10-CM | POA: Diagnosis not present

## 2019-01-30 DIAGNOSIS — D680 Von Willebrand's disease: Secondary | ICD-10-CM | POA: Diagnosis not present

## 2019-01-30 DIAGNOSIS — Z79899 Other long term (current) drug therapy: Secondary | ICD-10-CM | POA: Diagnosis not present

## 2019-01-30 DIAGNOSIS — M5412 Radiculopathy, cervical region: Secondary | ICD-10-CM | POA: Diagnosis not present

## 2019-01-30 DIAGNOSIS — M4802 Spinal stenosis, cervical region: Secondary | ICD-10-CM | POA: Diagnosis not present

## 2019-01-30 DIAGNOSIS — F419 Anxiety disorder, unspecified: Secondary | ICD-10-CM | POA: Diagnosis not present

## 2019-01-30 DIAGNOSIS — Z885 Allergy status to narcotic agent status: Secondary | ICD-10-CM | POA: Diagnosis not present

## 2019-01-30 DIAGNOSIS — M50022 Cervical disc disorder at C5-C6 level with myelopathy: Secondary | ICD-10-CM | POA: Diagnosis not present

## 2019-01-30 DIAGNOSIS — Z881 Allergy status to other antibiotic agents status: Secondary | ICD-10-CM | POA: Diagnosis not present

## 2019-01-31 DIAGNOSIS — F419 Anxiety disorder, unspecified: Secondary | ICD-10-CM | POA: Diagnosis not present

## 2019-01-31 DIAGNOSIS — M50022 Cervical disc disorder at C5-C6 level with myelopathy: Secondary | ICD-10-CM | POA: Diagnosis not present

## 2019-01-31 DIAGNOSIS — Z79899 Other long term (current) drug therapy: Secondary | ICD-10-CM | POA: Diagnosis not present

## 2019-01-31 DIAGNOSIS — M50122 Cervical disc disorder at C5-C6 level with radiculopathy: Secondary | ICD-10-CM | POA: Diagnosis not present

## 2019-01-31 DIAGNOSIS — R51 Headache: Secondary | ICD-10-CM | POA: Diagnosis not present

## 2019-01-31 DIAGNOSIS — M50021 Cervical disc disorder at C4-C5 level with myelopathy: Secondary | ICD-10-CM | POA: Diagnosis not present

## 2019-01-31 DIAGNOSIS — D680 Von Willebrand's disease: Secondary | ICD-10-CM | POA: Diagnosis not present

## 2019-01-31 DIAGNOSIS — Z885 Allergy status to narcotic agent status: Secondary | ICD-10-CM | POA: Diagnosis not present

## 2019-01-31 DIAGNOSIS — M4802 Spinal stenosis, cervical region: Secondary | ICD-10-CM | POA: Diagnosis not present

## 2019-01-31 DIAGNOSIS — Z881 Allergy status to other antibiotic agents status: Secondary | ICD-10-CM | POA: Diagnosis not present

## 2019-01-31 DIAGNOSIS — M4722 Other spondylosis with radiculopathy, cervical region: Secondary | ICD-10-CM | POA: Diagnosis not present

## 2019-01-31 DIAGNOSIS — E039 Hypothyroidism, unspecified: Secondary | ICD-10-CM | POA: Diagnosis not present

## 2019-01-31 DIAGNOSIS — M4712 Other spondylosis with myelopathy, cervical region: Secondary | ICD-10-CM | POA: Diagnosis not present

## 2019-01-31 DIAGNOSIS — M50121 Cervical disc disorder at C4-C5 level with radiculopathy: Secondary | ICD-10-CM | POA: Diagnosis not present

## 2019-02-21 DIAGNOSIS — M859 Disorder of bone density and structure, unspecified: Secondary | ICD-10-CM | POA: Diagnosis not present

## 2019-02-21 DIAGNOSIS — R82998 Other abnormal findings in urine: Secondary | ICD-10-CM | POA: Diagnosis not present

## 2019-02-21 DIAGNOSIS — E038 Other specified hypothyroidism: Secondary | ICD-10-CM | POA: Diagnosis not present

## 2019-02-21 DIAGNOSIS — Z0000 Encounter for general adult medical examination without abnormal findings: Secondary | ICD-10-CM | POA: Diagnosis not present

## 2019-02-28 DIAGNOSIS — Z0000 Encounter for general adult medical examination without abnormal findings: Secondary | ICD-10-CM | POA: Diagnosis not present

## 2019-02-28 DIAGNOSIS — Z1339 Encounter for screening examination for other mental health and behavioral disorders: Secondary | ICD-10-CM | POA: Diagnosis not present

## 2019-02-28 DIAGNOSIS — M5030 Other cervical disc degeneration, unspecified cervical region: Secondary | ICD-10-CM | POA: Diagnosis not present

## 2019-02-28 DIAGNOSIS — H269 Unspecified cataract: Secondary | ICD-10-CM | POA: Diagnosis not present

## 2019-02-28 DIAGNOSIS — Z1331 Encounter for screening for depression: Secondary | ICD-10-CM | POA: Diagnosis not present

## 2019-02-28 DIAGNOSIS — H04129 Dry eye syndrome of unspecified lacrimal gland: Secondary | ICD-10-CM | POA: Diagnosis not present

## 2019-02-28 DIAGNOSIS — D680 Von Willebrand's disease: Secondary | ICD-10-CM | POA: Diagnosis not present

## 2019-04-15 DIAGNOSIS — Z0000 Encounter for general adult medical examination without abnormal findings: Secondary | ICD-10-CM | POA: Diagnosis not present

## 2019-04-30 DIAGNOSIS — M4322 Fusion of spine, cervical region: Secondary | ICD-10-CM | POA: Diagnosis not present

## 2019-04-30 DIAGNOSIS — M5012 Mid-cervical disc disorder, unspecified level: Secondary | ICD-10-CM | POA: Diagnosis not present

## 2019-05-26 DIAGNOSIS — H04123 Dry eye syndrome of bilateral lacrimal glands: Secondary | ICD-10-CM | POA: Diagnosis not present

## 2019-05-26 DIAGNOSIS — H5231 Anisometropia: Secondary | ICD-10-CM | POA: Diagnosis not present

## 2019-05-26 DIAGNOSIS — H5500 Unspecified nystagmus: Secondary | ICD-10-CM | POA: Diagnosis not present

## 2019-05-26 DIAGNOSIS — H5213 Myopia, bilateral: Secondary | ICD-10-CM | POA: Diagnosis not present

## 2019-06-30 DIAGNOSIS — Z170 Estrogen receptor positive status [ER+]: Secondary | ICD-10-CM | POA: Diagnosis not present

## 2019-06-30 DIAGNOSIS — C50919 Malignant neoplasm of unspecified site of unspecified female breast: Secondary | ICD-10-CM | POA: Diagnosis not present

## 2019-07-09 DIAGNOSIS — M47812 Spondylosis without myelopathy or radiculopathy, cervical region: Secondary | ICD-10-CM | POA: Diagnosis not present

## 2019-07-09 DIAGNOSIS — M4802 Spinal stenosis, cervical region: Secondary | ICD-10-CM | POA: Diagnosis not present

## 2019-07-09 DIAGNOSIS — G960 Cerebrospinal fluid leak: Secondary | ICD-10-CM | POA: Diagnosis not present

## 2019-07-09 DIAGNOSIS — M5416 Radiculopathy, lumbar region: Secondary | ICD-10-CM | POA: Diagnosis not present

## 2019-07-09 DIAGNOSIS — M5412 Radiculopathy, cervical region: Secondary | ICD-10-CM | POA: Diagnosis not present

## 2019-07-10 DIAGNOSIS — Z20828 Contact with and (suspected) exposure to other viral communicable diseases: Secondary | ICD-10-CM | POA: Diagnosis not present

## 2019-07-10 DIAGNOSIS — J029 Acute pharyngitis, unspecified: Secondary | ICD-10-CM | POA: Diagnosis not present

## 2019-07-10 DIAGNOSIS — R51 Headache: Secondary | ICD-10-CM | POA: Diagnosis not present

## 2019-07-29 DIAGNOSIS — Z1159 Encounter for screening for other viral diseases: Secondary | ICD-10-CM | POA: Diagnosis not present

## 2019-08-04 DIAGNOSIS — F419 Anxiety disorder, unspecified: Secondary | ICD-10-CM | POA: Diagnosis not present

## 2019-08-04 DIAGNOSIS — E039 Hypothyroidism, unspecified: Secondary | ICD-10-CM | POA: Diagnosis not present

## 2019-08-04 DIAGNOSIS — K5909 Other constipation: Secondary | ICD-10-CM | POA: Diagnosis not present

## 2019-08-04 DIAGNOSIS — Z01419 Encounter for gynecological examination (general) (routine) without abnormal findings: Secondary | ICD-10-CM | POA: Diagnosis not present

## 2019-08-04 DIAGNOSIS — F330 Major depressive disorder, recurrent, mild: Secondary | ICD-10-CM | POA: Diagnosis not present

## 2019-08-04 DIAGNOSIS — Z6820 Body mass index (BMI) 20.0-20.9, adult: Secondary | ICD-10-CM | POA: Diagnosis not present

## 2019-08-04 DIAGNOSIS — Z01411 Encounter for gynecological examination (general) (routine) with abnormal findings: Secondary | ICD-10-CM | POA: Diagnosis not present

## 2019-09-15 DIAGNOSIS — Z23 Encounter for immunization: Secondary | ICD-10-CM | POA: Diagnosis not present

## 2019-10-07 DIAGNOSIS — R519 Headache, unspecified: Secondary | ICD-10-CM | POA: Diagnosis not present

## 2019-10-07 DIAGNOSIS — H93A1 Pulsatile tinnitus, right ear: Secondary | ICD-10-CM | POA: Diagnosis not present

## 2019-10-20 DIAGNOSIS — R519 Headache, unspecified: Secondary | ICD-10-CM | POA: Diagnosis not present

## 2019-11-24 DIAGNOSIS — H93A1 Pulsatile tinnitus, right ear: Secondary | ICD-10-CM | POA: Diagnosis not present

## 2019-11-24 DIAGNOSIS — H93A9 Pulsatile tinnitus, unspecified ear: Secondary | ICD-10-CM | POA: Diagnosis not present

## 2019-12-30 DIAGNOSIS — Z23 Encounter for immunization: Secondary | ICD-10-CM | POA: Diagnosis not present

## 2020-01-20 DIAGNOSIS — Z23 Encounter for immunization: Secondary | ICD-10-CM | POA: Diagnosis not present

## 2020-02-02 DIAGNOSIS — H903 Sensorineural hearing loss, bilateral: Secondary | ICD-10-CM | POA: Diagnosis not present

## 2020-02-02 DIAGNOSIS — H93A1 Pulsatile tinnitus, right ear: Secondary | ICD-10-CM | POA: Diagnosis not present

## 2020-02-02 DIAGNOSIS — H6983 Other specified disorders of Eustachian tube, bilateral: Secondary | ICD-10-CM | POA: Diagnosis not present

## 2020-02-02 DIAGNOSIS — Z8669 Personal history of other diseases of the nervous system and sense organs: Secondary | ICD-10-CM | POA: Diagnosis not present

## 2020-02-03 DIAGNOSIS — C50919 Malignant neoplasm of unspecified site of unspecified female breast: Secondary | ICD-10-CM | POA: Diagnosis not present

## 2020-02-03 DIAGNOSIS — Z170 Estrogen receptor positive status [ER+]: Secondary | ICD-10-CM | POA: Diagnosis not present

## 2020-02-03 DIAGNOSIS — Z9221 Personal history of antineoplastic chemotherapy: Secondary | ICD-10-CM | POA: Diagnosis not present

## 2020-02-03 DIAGNOSIS — Z9013 Acquired absence of bilateral breasts and nipples: Secondary | ICD-10-CM | POA: Diagnosis not present

## 2020-02-03 DIAGNOSIS — Z7983 Long term (current) use of bisphosphonates: Secondary | ICD-10-CM | POA: Diagnosis not present

## 2020-02-16 DIAGNOSIS — H93A1 Pulsatile tinnitus, right ear: Secondary | ICD-10-CM | POA: Diagnosis not present

## 2020-02-16 DIAGNOSIS — H6983 Other specified disorders of Eustachian tube, bilateral: Secondary | ICD-10-CM | POA: Diagnosis not present

## 2020-02-23 DIAGNOSIS — Z0000 Encounter for general adult medical examination without abnormal findings: Secondary | ICD-10-CM | POA: Diagnosis not present

## 2020-02-23 DIAGNOSIS — R7989 Other specified abnormal findings of blood chemistry: Secondary | ICD-10-CM | POA: Diagnosis not present

## 2020-02-23 DIAGNOSIS — M859 Disorder of bone density and structure, unspecified: Secondary | ICD-10-CM | POA: Diagnosis not present

## 2020-02-23 DIAGNOSIS — E039 Hypothyroidism, unspecified: Secondary | ICD-10-CM | POA: Diagnosis not present

## 2020-03-01 DIAGNOSIS — D680 Von Willebrand's disease: Secondary | ICD-10-CM | POA: Diagnosis not present

## 2020-03-01 DIAGNOSIS — Z1331 Encounter for screening for depression: Secondary | ICD-10-CM | POA: Diagnosis not present

## 2020-03-01 DIAGNOSIS — H269 Unspecified cataract: Secondary | ICD-10-CM | POA: Diagnosis not present

## 2020-03-01 DIAGNOSIS — M5030 Other cervical disc degeneration, unspecified cervical region: Secondary | ICD-10-CM | POA: Diagnosis not present

## 2020-03-01 DIAGNOSIS — H9311 Tinnitus, right ear: Secondary | ICD-10-CM | POA: Diagnosis not present

## 2020-03-01 DIAGNOSIS — Z0000 Encounter for general adult medical examination without abnormal findings: Secondary | ICD-10-CM | POA: Diagnosis not present

## 2020-03-09 DIAGNOSIS — M8589 Other specified disorders of bone density and structure, multiple sites: Secondary | ICD-10-CM | POA: Diagnosis not present

## 2020-04-29 ENCOUNTER — Other Ambulatory Visit: Payer: Self-pay | Admitting: Obstetrics and Gynecology

## 2020-04-29 DIAGNOSIS — N63 Unspecified lump in unspecified breast: Secondary | ICD-10-CM

## 2020-04-29 DIAGNOSIS — M79622 Pain in left upper arm: Secondary | ICD-10-CM | POA: Diagnosis not present

## 2020-04-30 ENCOUNTER — Other Ambulatory Visit: Payer: Self-pay

## 2020-04-30 ENCOUNTER — Ambulatory Visit
Admission: RE | Admit: 2020-04-30 | Discharge: 2020-04-30 | Disposition: A | Discharge status: Home / Self Care | Payer: BC Managed Care – PPO | Source: Ambulatory Visit | Attending: Obstetrics and Gynecology | Admitting: Obstetrics and Gynecology

## 2020-04-30 DIAGNOSIS — N6489 Other specified disorders of breast: Secondary | ICD-10-CM | POA: Diagnosis not present

## 2020-04-30 DIAGNOSIS — R2232 Localized swelling, mass and lump, left upper limb: Secondary | ICD-10-CM | POA: Diagnosis not present

## 2020-04-30 DIAGNOSIS — N63 Unspecified lump in unspecified breast: Secondary | ICD-10-CM

## 2020-05-07 DIAGNOSIS — C50919 Malignant neoplasm of unspecified site of unspecified female breast: Secondary | ICD-10-CM | POA: Diagnosis not present

## 2020-05-07 DIAGNOSIS — B029 Zoster without complications: Secondary | ICD-10-CM | POA: Diagnosis not present

## 2020-05-07 DIAGNOSIS — R5383 Other fatigue: Secondary | ICD-10-CM | POA: Diagnosis not present

## 2020-05-07 DIAGNOSIS — K1379 Other lesions of oral mucosa: Secondary | ICD-10-CM | POA: Diagnosis not present

## 2020-06-15 DIAGNOSIS — Z111 Encounter for screening for respiratory tuberculosis: Secondary | ICD-10-CM | POA: Diagnosis not present

## 2020-07-11 DIAGNOSIS — R509 Fever, unspecified: Secondary | ICD-10-CM | POA: Diagnosis not present

## 2020-07-11 DIAGNOSIS — J029 Acute pharyngitis, unspecified: Secondary | ICD-10-CM | POA: Diagnosis not present

## 2020-07-11 DIAGNOSIS — R6883 Chills (without fever): Secondary | ICD-10-CM | POA: Diagnosis not present

## 2020-07-11 DIAGNOSIS — R52 Pain, unspecified: Secondary | ICD-10-CM | POA: Diagnosis not present

## 2020-07-14 ENCOUNTER — Other Ambulatory Visit: Payer: Self-pay | Admitting: Physician Assistant

## 2020-07-14 DIAGNOSIS — R05 Cough: Secondary | ICD-10-CM | POA: Diagnosis not present

## 2020-07-14 DIAGNOSIS — U071 COVID-19: Secondary | ICD-10-CM

## 2020-07-14 DIAGNOSIS — C50919 Malignant neoplasm of unspecified site of unspecified female breast: Secondary | ICD-10-CM | POA: Diagnosis not present

## 2020-07-14 DIAGNOSIS — Z1152 Encounter for screening for COVID-19: Secondary | ICD-10-CM | POA: Diagnosis not present

## 2020-07-14 NOTE — Progress Notes (Signed)
I connected by phone with Alyssa Contreras on 07/14/2020 at 9:16 PM to discuss the potential use of a new treatment for mild to moderate COVID-19 viral infection in non-hospitalized patients.  This patient is a 49 y.o. female that meets the FDA criteria for Emergency Use Authorization of COVID monoclonal antibody casirivimab/imdevimab.  Has a (+) direct SARS-CoV-2 viral test result  Has mild or moderate COVID-19   Is NOT hospitalized due to COVID-19  Is within 10 days of symptom onset  Has at least one of the high risk factor(s) for progression to severe COVID-19 and/or hospitalization as defined in EUA.  Specific high risk criteria : Immunosuppressive Disease or Treatment   I have spoken and communicated the following to the patient or parent/caregiver regarding COVID monoclonal antibody treatment:  1. FDA has authorized the emergency use for the treatment of mild to moderate COVID-19 in adults and pediatric patients with positive results of direct SARS-CoV-2 viral testing who are 26 years of age and older weighing at least 40 kg, and who are at high risk for progressing to severe COVID-19 and/or hospitalization.  2. The significant known and potential risks and benefits of COVID monoclonal antibody, and the extent to which such potential risks and benefits are unknown.  3. Information on available alternative treatments and the risks and benefits of those alternatives, including clinical trials.  4. Patients treated with COVID monoclonal antibody should continue to self-isolate and use infection control measures (e.g., wear mask, isolate, social distance, avoid sharing personal items, clean and disinfect "high touch" surfaces, and frequent handwashing) according to CDC guidelines.   5. The patient or parent/caregiver has the option to accept or refuse COVID monoclonal antibody treatment.  After reviewing this information with the patient, The patient agreed to proceed with receiving  casirivimab\imdevimab infusion and will be provided a copy of the Fact sheet prior to receiving the infusion.  Sx onset 7/24. Set up for infusion on 7/30 @ 8:30am. Directions given to [facility]. Pt is aware that insurance will be charged an infusion fee.   [name] 07/14/2020 9:16 PM

## 2020-07-15 ENCOUNTER — Ambulatory Visit (HOSPITAL_COMMUNITY)
Admission: RE | Admit: 2020-07-15 | Discharge: 2020-07-15 | Disposition: A | Discharge status: Home / Self Care | Payer: BC Managed Care – PPO | Source: Ambulatory Visit | Attending: Pulmonary Disease | Admitting: Pulmonary Disease

## 2020-07-15 DIAGNOSIS — U071 COVID-19: Secondary | ICD-10-CM | POA: Diagnosis not present

## 2020-07-15 DIAGNOSIS — C50919 Malignant neoplasm of unspecified site of unspecified female breast: Secondary | ICD-10-CM | POA: Insufficient documentation

## 2020-07-15 MED ORDER — ALBUTEROL SULFATE HFA 108 (90 BASE) MCG/ACT IN AERS: 2.0000 | INHALATION_SPRAY | Freq: Once | RESPIRATORY_TRACT | Status: DC | PRN

## 2020-07-15 MED ORDER — SODIUM CHLORIDE 0.9 % IV SOLN: INTRAVENOUS | Status: DC | PRN

## 2020-07-15 MED ORDER — EPINEPHRINE 0.3 MG/0.3ML IJ SOAJ: 0.3000 mg | Freq: Once | INTRAMUSCULAR | Status: DC | PRN

## 2020-07-15 MED ORDER — METHYLPREDNISOLONE SODIUM SUCC 125 MG IJ SOLR: 125.0000 mg | Freq: Once | INTRAMUSCULAR | Status: DC | PRN

## 2020-07-15 MED ORDER — FAMOTIDINE IN NACL 20-0.9 MG/50ML-% IV SOLN: 20.0000 mg | Freq: Once | INTRAVENOUS | Status: DC | PRN

## 2020-07-15 MED ORDER — DIPHENHYDRAMINE HCL 50 MG/ML IJ SOLN: 50.0000 mg | Freq: Once | INTRAMUSCULAR | Status: DC | PRN

## 2020-07-15 MED ORDER — SODIUM CHLORIDE 0.9 % IV SOLN
Freq: Once | INTRAVENOUS | Status: AC
  Filled 2020-07-15: qty 600

## 2020-07-15 NOTE — Progress Notes (Signed)
  Diagnosis: COVID-19  Physician: Dr Joya Gaskins  Procedure: [facility] Infusion Clinic Med: casirivimab\imdevimab infusion - Provided patient with casirivimab\imdevimab fact sheet for patients, parents and caregivers prior to infusion.  Complications: No immediate complications noted.  Discharge: Discharged home   Alyssa Contreras 07/15/2020

## 2020-07-15 NOTE — Discharge Instructions (Signed)

## 2020-09-03 DIAGNOSIS — G4733 Obstructive sleep apnea (adult) (pediatric): Secondary | ICD-10-CM | POA: Diagnosis not present

## 2020-10-02 DIAGNOSIS — Z23 Encounter for immunization: Secondary | ICD-10-CM | POA: Diagnosis not present

## 2020-11-08 DIAGNOSIS — D2262 Melanocytic nevi of left upper limb, including shoulder: Secondary | ICD-10-CM | POA: Diagnosis not present

## 2020-11-08 DIAGNOSIS — D225 Melanocytic nevi of trunk: Secondary | ICD-10-CM | POA: Diagnosis not present

## 2020-11-08 DIAGNOSIS — D2271 Melanocytic nevi of right lower limb, including hip: Secondary | ICD-10-CM | POA: Diagnosis not present

## 2020-11-08 DIAGNOSIS — D2261 Melanocytic nevi of right upper limb, including shoulder: Secondary | ICD-10-CM | POA: Diagnosis not present

## 2020-12-24 DIAGNOSIS — Z6820 Body mass index (BMI) 20.0-20.9, adult: Secondary | ICD-10-CM | POA: Diagnosis not present

## 2020-12-24 DIAGNOSIS — Z01419 Encounter for gynecological examination (general) (routine) without abnormal findings: Secondary | ICD-10-CM | POA: Diagnosis not present

## 2020-12-24 DIAGNOSIS — R87615 Unsatisfactory cytologic smear of cervix: Secondary | ICD-10-CM | POA: Diagnosis not present

## 2020-12-28 DIAGNOSIS — Z131 Encounter for screening for diabetes mellitus: Secondary | ICD-10-CM | POA: Diagnosis not present

## 2020-12-28 DIAGNOSIS — Z130 Encounter for screening for diseases of the blood and blood-forming organs and certain disorders involving the immune mechanism: Secondary | ICD-10-CM | POA: Diagnosis not present

## 2020-12-28 DIAGNOSIS — Z13228 Encounter for screening for other metabolic disorders: Secondary | ICD-10-CM | POA: Diagnosis not present

## 2020-12-28 DIAGNOSIS — E039 Hypothyroidism, unspecified: Secondary | ICD-10-CM | POA: Diagnosis not present

## 2020-12-28 DIAGNOSIS — Z13220 Encounter for screening for lipoid disorders: Secondary | ICD-10-CM | POA: Diagnosis not present

## 2021-01-11 DIAGNOSIS — Z01419 Encounter for gynecological examination (general) (routine) without abnormal findings: Secondary | ICD-10-CM | POA: Diagnosis not present

## 2021-01-11 DIAGNOSIS — D709 Neutropenia, unspecified: Secondary | ICD-10-CM | POA: Diagnosis not present

## 2021-01-26 DIAGNOSIS — H16223 Keratoconjunctivitis sicca, not specified as Sjogren's, bilateral: Secondary | ICD-10-CM | POA: Diagnosis not present

## 2021-01-26 DIAGNOSIS — H25812 Combined forms of age-related cataract, left eye: Secondary | ICD-10-CM | POA: Diagnosis not present

## 2021-01-26 DIAGNOSIS — H5213 Myopia, bilateral: Secondary | ICD-10-CM | POA: Diagnosis not present

## 2021-01-26 DIAGNOSIS — H5500 Unspecified nystagmus: Secondary | ICD-10-CM | POA: Diagnosis not present

## 2021-02-18 DIAGNOSIS — Z170 Estrogen receptor positive status [ER+]: Secondary | ICD-10-CM | POA: Diagnosis not present

## 2021-02-18 DIAGNOSIS — Z9221 Personal history of antineoplastic chemotherapy: Secondary | ICD-10-CM | POA: Diagnosis not present

## 2021-02-18 DIAGNOSIS — C50919 Malignant neoplasm of unspecified site of unspecified female breast: Secondary | ICD-10-CM | POA: Diagnosis not present

## 2021-02-18 DIAGNOSIS — Z9013 Acquired absence of bilateral breasts and nipples: Secondary | ICD-10-CM | POA: Diagnosis not present

## 2021-03-01 DIAGNOSIS — R0683 Snoring: Secondary | ICD-10-CM | POA: Diagnosis not present

## 2021-03-01 DIAGNOSIS — G4733 Obstructive sleep apnea (adult) (pediatric): Secondary | ICD-10-CM | POA: Diagnosis not present

## 2021-03-15 DIAGNOSIS — Z20822 Contact with and (suspected) exposure to covid-19: Secondary | ICD-10-CM | POA: Diagnosis not present

## 2021-03-16 DIAGNOSIS — G4733 Obstructive sleep apnea (adult) (pediatric): Secondary | ICD-10-CM | POA: Diagnosis not present

## 2021-06-25 DIAGNOSIS — S63635A Sprain of interphalangeal joint of left ring finger, initial encounter: Secondary | ICD-10-CM | POA: Diagnosis not present

## 2021-06-25 DIAGNOSIS — S63502A Unspecified sprain of left wrist, initial encounter: Secondary | ICD-10-CM | POA: Diagnosis not present

## 2021-06-25 DIAGNOSIS — S52125A Nondisplaced fracture of head of left radius, initial encounter for closed fracture: Secondary | ICD-10-CM | POA: Diagnosis not present

## 2021-07-01 DIAGNOSIS — M25521 Pain in right elbow: Secondary | ICD-10-CM | POA: Diagnosis not present

## 2021-07-01 DIAGNOSIS — S52124A Nondisplaced fracture of head of right radius, initial encounter for closed fracture: Secondary | ICD-10-CM | POA: Diagnosis not present

## 2021-07-26 DIAGNOSIS — J029 Acute pharyngitis, unspecified: Secondary | ICD-10-CM | POA: Diagnosis not present

## 2021-07-26 DIAGNOSIS — J189 Pneumonia, unspecified organism: Secondary | ICD-10-CM | POA: Diagnosis not present

## 2021-07-26 DIAGNOSIS — R051 Acute cough: Secondary | ICD-10-CM | POA: Diagnosis not present

## 2021-08-29 DIAGNOSIS — M25572 Pain in left ankle and joints of left foot: Secondary | ICD-10-CM | POA: Diagnosis not present

## 2021-08-29 DIAGNOSIS — M79672 Pain in left foot: Secondary | ICD-10-CM | POA: Diagnosis not present

## 2021-09-01 DIAGNOSIS — R059 Cough, unspecified: Secondary | ICD-10-CM | POA: Diagnosis not present

## 2021-09-14 DIAGNOSIS — M79672 Pain in left foot: Secondary | ICD-10-CM | POA: Diagnosis not present

## 2021-09-14 DIAGNOSIS — M67962 Unspecified disorder of synovium and tendon, left lower leg: Secondary | ICD-10-CM | POA: Diagnosis not present

## 2021-09-14 DIAGNOSIS — M25572 Pain in left ankle and joints of left foot: Secondary | ICD-10-CM | POA: Diagnosis not present

## 2021-09-22 DIAGNOSIS — M67962 Unspecified disorder of synovium and tendon, left lower leg: Secondary | ICD-10-CM | POA: Diagnosis not present

## 2021-10-11 DIAGNOSIS — M5416 Radiculopathy, lumbar region: Secondary | ICD-10-CM | POA: Diagnosis not present

## 2021-10-25 ENCOUNTER — Other Ambulatory Visit: Payer: Self-pay | Admitting: Obstetrics and Gynecology

## 2021-10-25 DIAGNOSIS — N6310 Unspecified lump in the right breast, unspecified quadrant: Secondary | ICD-10-CM | POA: Diagnosis not present

## 2021-11-07 ENCOUNTER — Ambulatory Visit
Admission: RE | Admit: 2021-11-07 | Discharge: 2021-11-07 | Disposition: A | Discharge status: Home / Self Care | Payer: BC Managed Care – PPO | Source: Ambulatory Visit | Attending: Obstetrics and Gynecology | Admitting: Obstetrics and Gynecology

## 2021-11-07 DIAGNOSIS — Z853 Personal history of malignant neoplasm of breast: Secondary | ICD-10-CM | POA: Diagnosis not present

## 2021-11-07 DIAGNOSIS — N6310 Unspecified lump in the right breast, unspecified quadrant: Secondary | ICD-10-CM

## 2021-11-29 DIAGNOSIS — D2262 Melanocytic nevi of left upper limb, including shoulder: Secondary | ICD-10-CM | POA: Diagnosis not present

## 2021-11-29 DIAGNOSIS — D2272 Melanocytic nevi of left lower limb, including hip: Secondary | ICD-10-CM | POA: Diagnosis not present

## 2021-11-29 DIAGNOSIS — D485 Neoplasm of uncertain behavior of skin: Secondary | ICD-10-CM | POA: Diagnosis not present

## 2021-11-29 DIAGNOSIS — L819 Disorder of pigmentation, unspecified: Secondary | ICD-10-CM | POA: Diagnosis not present

## 2021-11-29 DIAGNOSIS — D2271 Melanocytic nevi of right lower limb, including hip: Secondary | ICD-10-CM | POA: Diagnosis not present

## 2021-11-29 DIAGNOSIS — D2261 Melanocytic nevi of right upper limb, including shoulder: Secondary | ICD-10-CM | POA: Diagnosis not present

## 2021-11-29 DIAGNOSIS — D225 Melanocytic nevi of trunk: Secondary | ICD-10-CM | POA: Diagnosis not present

## 2021-12-15 DIAGNOSIS — R058 Other specified cough: Secondary | ICD-10-CM | POA: Diagnosis not present

## 2021-12-15 DIAGNOSIS — J0100 Acute maxillary sinusitis, unspecified: Secondary | ICD-10-CM | POA: Diagnosis not present

## 2021-12-15 DIAGNOSIS — R0981 Nasal congestion: Secondary | ICD-10-CM | POA: Diagnosis not present

## 2021-12-27 DIAGNOSIS — D485 Neoplasm of uncertain behavior of skin: Secondary | ICD-10-CM | POA: Diagnosis not present

## 2021-12-27 DIAGNOSIS — L988 Other specified disorders of the skin and subcutaneous tissue: Secondary | ICD-10-CM | POA: Diagnosis not present

## 2022-01-03 DIAGNOSIS — N6315 Unspecified lump in the right breast, overlapping quadrants: Secondary | ICD-10-CM | POA: Diagnosis not present

## 2022-01-03 DIAGNOSIS — Z7722 Contact with and (suspected) exposure to environmental tobacco smoke (acute) (chronic): Secondary | ICD-10-CM | POA: Diagnosis not present

## 2022-01-03 DIAGNOSIS — R590 Localized enlarged lymph nodes: Secondary | ICD-10-CM | POA: Diagnosis not present

## 2022-01-03 DIAGNOSIS — Z87891 Personal history of nicotine dependence: Secondary | ICD-10-CM | POA: Diagnosis not present

## 2022-01-03 DIAGNOSIS — Z9013 Acquired absence of bilateral breasts and nipples: Secondary | ICD-10-CM | POA: Diagnosis not present

## 2022-01-03 DIAGNOSIS — R053 Chronic cough: Secondary | ICD-10-CM | POA: Diagnosis not present

## 2022-01-03 DIAGNOSIS — N6310 Unspecified lump in the right breast, unspecified quadrant: Secondary | ICD-10-CM | POA: Diagnosis not present

## 2022-01-09 DIAGNOSIS — Z9013 Acquired absence of bilateral breasts and nipples: Secondary | ICD-10-CM | POA: Diagnosis not present

## 2022-01-09 DIAGNOSIS — C50919 Malignant neoplasm of unspecified site of unspecified female breast: Secondary | ICD-10-CM | POA: Diagnosis not present

## 2022-01-09 DIAGNOSIS — Z170 Estrogen receptor positive status [ER+]: Secondary | ICD-10-CM | POA: Diagnosis not present

## 2022-01-09 DIAGNOSIS — N6310 Unspecified lump in the right breast, unspecified quadrant: Secondary | ICD-10-CM | POA: Diagnosis not present

## 2022-01-09 DIAGNOSIS — Z853 Personal history of malignant neoplasm of breast: Secondary | ICD-10-CM | POA: Diagnosis not present

## 2022-01-17 DIAGNOSIS — Z1211 Encounter for screening for malignant neoplasm of colon: Secondary | ICD-10-CM | POA: Diagnosis not present

## 2022-01-17 DIAGNOSIS — Z86010 Personal history of colonic polyps: Secondary | ICD-10-CM | POA: Diagnosis not present

## 2022-01-18 DIAGNOSIS — Z853 Personal history of malignant neoplasm of breast: Secondary | ICD-10-CM | POA: Diagnosis not present

## 2022-01-18 DIAGNOSIS — R918 Other nonspecific abnormal finding of lung field: Secondary | ICD-10-CM | POA: Diagnosis not present

## 2022-01-18 DIAGNOSIS — Z7722 Contact with and (suspected) exposure to environmental tobacco smoke (acute) (chronic): Secondary | ICD-10-CM | POA: Diagnosis not present

## 2022-01-18 DIAGNOSIS — R053 Chronic cough: Secondary | ICD-10-CM | POA: Diagnosis not present

## 2022-01-18 DIAGNOSIS — R590 Localized enlarged lymph nodes: Secondary | ICD-10-CM | POA: Diagnosis not present

## 2022-01-18 DIAGNOSIS — Z87891 Personal history of nicotine dependence: Secondary | ICD-10-CM | POA: Diagnosis not present

## 2022-01-18 DIAGNOSIS — Z9013 Acquired absence of bilateral breasts and nipples: Secondary | ICD-10-CM | POA: Diagnosis not present

## 2022-01-18 DIAGNOSIS — E042 Nontoxic multinodular goiter: Secondary | ICD-10-CM | POA: Diagnosis not present

## 2022-01-18 DIAGNOSIS — N6310 Unspecified lump in the right breast, unspecified quadrant: Secondary | ICD-10-CM | POA: Diagnosis not present

## 2022-02-02 DIAGNOSIS — R053 Chronic cough: Secondary | ICD-10-CM | POA: Diagnosis not present

## 2022-02-02 DIAGNOSIS — R0602 Shortness of breath: Secondary | ICD-10-CM | POA: Diagnosis not present

## 2022-02-02 DIAGNOSIS — Z87891 Personal history of nicotine dependence: Secondary | ICD-10-CM | POA: Diagnosis not present

## 2022-02-02 DIAGNOSIS — R918 Other nonspecific abnormal finding of lung field: Secondary | ICD-10-CM | POA: Diagnosis not present

## 2022-02-02 DIAGNOSIS — A499 Bacterial infection, unspecified: Secondary | ICD-10-CM | POA: Diagnosis not present

## 2022-02-09 DIAGNOSIS — Z01419 Encounter for gynecological examination (general) (routine) without abnormal findings: Secondary | ICD-10-CM | POA: Diagnosis not present

## 2022-02-09 DIAGNOSIS — E039 Hypothyroidism, unspecified: Secondary | ICD-10-CM | POA: Diagnosis not present

## 2022-02-09 DIAGNOSIS — Z1272 Encounter for screening for malignant neoplasm of vagina: Secondary | ICD-10-CM | POA: Diagnosis not present

## 2022-02-09 DIAGNOSIS — Z6820 Body mass index (BMI) 20.0-20.9, adult: Secondary | ICD-10-CM | POA: Diagnosis not present

## 2022-02-16 DIAGNOSIS — E039 Hypothyroidism, unspecified: Secondary | ICD-10-CM | POA: Diagnosis not present

## 2022-02-16 DIAGNOSIS — M859 Disorder of bone density and structure, unspecified: Secondary | ICD-10-CM | POA: Diagnosis not present

## 2022-02-23 DIAGNOSIS — Z1331 Encounter for screening for depression: Secondary | ICD-10-CM | POA: Diagnosis not present

## 2022-02-23 DIAGNOSIS — G4733 Obstructive sleep apnea (adult) (pediatric): Secondary | ICD-10-CM | POA: Diagnosis not present

## 2022-02-23 DIAGNOSIS — Z0000 Encounter for general adult medical examination without abnormal findings: Secondary | ICD-10-CM | POA: Diagnosis not present

## 2022-02-23 DIAGNOSIS — Z23 Encounter for immunization: Secondary | ICD-10-CM | POA: Diagnosis not present

## 2022-03-01 DIAGNOSIS — H524 Presbyopia: Secondary | ICD-10-CM | POA: Diagnosis not present

## 2022-03-01 DIAGNOSIS — H04123 Dry eye syndrome of bilateral lacrimal glands: Secondary | ICD-10-CM | POA: Diagnosis not present

## 2022-03-20 DIAGNOSIS — M8589 Other specified disorders of bone density and structure, multiple sites: Secondary | ICD-10-CM | POA: Diagnosis not present

## 2022-06-16 DIAGNOSIS — K136 Irritative hyperplasia of oral mucosa: Secondary | ICD-10-CM | POA: Diagnosis not present

## 2022-06-16 DIAGNOSIS — D3709 Neoplasm of uncertain behavior of other specified sites of the oral cavity: Secondary | ICD-10-CM | POA: Diagnosis not present

## 2022-07-13 DIAGNOSIS — H02831 Dermatochalasis of right upper eyelid: Secondary | ICD-10-CM | POA: Diagnosis not present

## 2022-07-13 DIAGNOSIS — H02832 Dermatochalasis of right lower eyelid: Secondary | ICD-10-CM | POA: Diagnosis not present

## 2022-07-13 DIAGNOSIS — H02834 Dermatochalasis of left upper eyelid: Secondary | ICD-10-CM | POA: Diagnosis not present

## 2022-07-13 DIAGNOSIS — H02835 Dermatochalasis of left lower eyelid: Secondary | ICD-10-CM | POA: Diagnosis not present

## 2022-08-25 DIAGNOSIS — E1110 Type 2 diabetes mellitus with ketoacidosis without coma: Secondary | ICD-10-CM | POA: Diagnosis not present

## 2022-11-21 DIAGNOSIS — Z23 Encounter for immunization: Secondary | ICD-10-CM | POA: Diagnosis not present

## 2022-11-28 DIAGNOSIS — Z79899 Other long term (current) drug therapy: Secondary | ICD-10-CM | POA: Diagnosis not present

## 2022-11-28 DIAGNOSIS — H02834 Dermatochalasis of left upper eyelid: Secondary | ICD-10-CM | POA: Diagnosis not present

## 2022-11-28 DIAGNOSIS — Z79890 Hormone replacement therapy (postmenopausal): Secondary | ICD-10-CM | POA: Diagnosis not present

## 2022-11-28 DIAGNOSIS — Z885 Allergy status to narcotic agent status: Secondary | ICD-10-CM | POA: Diagnosis not present

## 2022-11-28 DIAGNOSIS — Z881 Allergy status to other antibiotic agents status: Secondary | ICD-10-CM | POA: Diagnosis not present

## 2022-11-28 DIAGNOSIS — D6800 Von Willebrand disease, unspecified: Secondary | ICD-10-CM | POA: Diagnosis not present

## 2022-11-28 DIAGNOSIS — H02833 Dermatochalasis of right eye, unspecified eyelid: Secondary | ICD-10-CM | POA: Diagnosis not present

## 2022-11-28 DIAGNOSIS — L988 Other specified disorders of the skin and subcutaneous tissue: Secondary | ICD-10-CM | POA: Diagnosis not present

## 2022-11-28 DIAGNOSIS — H02835 Dermatochalasis of left lower eyelid: Secondary | ICD-10-CM | POA: Diagnosis not present

## 2022-11-28 DIAGNOSIS — H02832 Dermatochalasis of right lower eyelid: Secondary | ICD-10-CM | POA: Diagnosis not present

## 2022-11-28 DIAGNOSIS — H02836 Dermatochalasis of left eye, unspecified eyelid: Secondary | ICD-10-CM | POA: Diagnosis not present

## 2022-11-28 DIAGNOSIS — Z888 Allergy status to other drugs, medicaments and biological substances status: Secondary | ICD-10-CM | POA: Diagnosis not present

## 2022-11-28 DIAGNOSIS — Z87891 Personal history of nicotine dependence: Secondary | ICD-10-CM | POA: Diagnosis not present

## 2022-11-28 DIAGNOSIS — E039 Hypothyroidism, unspecified: Secondary | ICD-10-CM | POA: Diagnosis not present

## 2022-11-28 DIAGNOSIS — H02831 Dermatochalasis of right upper eyelid: Secondary | ICD-10-CM | POA: Diagnosis not present

## 2022-11-28 DIAGNOSIS — M1990 Unspecified osteoarthritis, unspecified site: Secondary | ICD-10-CM | POA: Diagnosis not present

## 2023-01-04 DIAGNOSIS — R590 Localized enlarged lymph nodes: Secondary | ICD-10-CM | POA: Diagnosis not present

## 2023-01-04 DIAGNOSIS — Z9013 Acquired absence of bilateral breasts and nipples: Secondary | ICD-10-CM | POA: Diagnosis not present

## 2023-01-04 DIAGNOSIS — N6310 Unspecified lump in the right breast, unspecified quadrant: Secondary | ICD-10-CM | POA: Diagnosis not present

## 2023-01-04 DIAGNOSIS — E041 Nontoxic single thyroid nodule: Secondary | ICD-10-CM | POA: Diagnosis not present

## 2023-01-04 DIAGNOSIS — Z9221 Personal history of antineoplastic chemotherapy: Secondary | ICD-10-CM | POA: Diagnosis not present

## 2023-01-30 DIAGNOSIS — D2272 Melanocytic nevi of left lower limb, including hip: Secondary | ICD-10-CM | POA: Diagnosis not present

## 2023-01-30 DIAGNOSIS — D225 Melanocytic nevi of trunk: Secondary | ICD-10-CM | POA: Diagnosis not present

## 2023-01-30 DIAGNOSIS — D2262 Melanocytic nevi of left upper limb, including shoulder: Secondary | ICD-10-CM | POA: Diagnosis not present

## 2023-01-30 DIAGNOSIS — D2261 Melanocytic nevi of right upper limb, including shoulder: Secondary | ICD-10-CM | POA: Diagnosis not present

## 2023-02-15 DIAGNOSIS — Z853 Personal history of malignant neoplasm of breast: Secondary | ICD-10-CM | POA: Diagnosis not present

## 2023-02-15 DIAGNOSIS — R918 Other nonspecific abnormal finding of lung field: Secondary | ICD-10-CM | POA: Diagnosis not present

## 2023-02-15 DIAGNOSIS — Z7182 Exercise counseling: Secondary | ICD-10-CM | POA: Diagnosis not present

## 2023-02-15 DIAGNOSIS — Z87891 Personal history of nicotine dependence: Secondary | ICD-10-CM | POA: Diagnosis not present

## 2023-02-15 DIAGNOSIS — R052 Subacute cough: Secondary | ICD-10-CM | POA: Diagnosis not present

## 2023-02-16 DIAGNOSIS — R918 Other nonspecific abnormal finding of lung field: Secondary | ICD-10-CM | POA: Diagnosis not present

## 2023-02-19 DIAGNOSIS — R7989 Other specified abnormal findings of blood chemistry: Secondary | ICD-10-CM | POA: Diagnosis not present

## 2023-02-19 DIAGNOSIS — E039 Hypothyroidism, unspecified: Secondary | ICD-10-CM | POA: Diagnosis not present

## 2023-02-19 DIAGNOSIS — M8580 Other specified disorders of bone density and structure, unspecified site: Secondary | ICD-10-CM | POA: Diagnosis not present

## 2023-03-02 DIAGNOSIS — R238 Other skin changes: Secondary | ICD-10-CM | POA: Diagnosis not present

## 2023-03-02 DIAGNOSIS — H0259 Other disorders affecting eyelid function: Secondary | ICD-10-CM | POA: Diagnosis not present

## 2023-03-08 DIAGNOSIS — D6800 Von Willebrand disease, unspecified: Secondary | ICD-10-CM | POA: Diagnosis not present

## 2023-03-08 DIAGNOSIS — Z853 Personal history of malignant neoplasm of breast: Secondary | ICD-10-CM | POA: Diagnosis not present

## 2023-03-08 DIAGNOSIS — F39 Unspecified mood [affective] disorder: Secondary | ICD-10-CM | POA: Diagnosis not present

## 2023-03-08 DIAGNOSIS — Z1331 Encounter for screening for depression: Secondary | ICD-10-CM | POA: Diagnosis not present

## 2023-03-08 DIAGNOSIS — Z01419 Encounter for gynecological examination (general) (routine) without abnormal findings: Secondary | ICD-10-CM | POA: Diagnosis not present

## 2023-03-08 DIAGNOSIS — Z0000 Encounter for general adult medical examination without abnormal findings: Secondary | ICD-10-CM | POA: Diagnosis not present

## 2023-03-08 DIAGNOSIS — I7300 Raynaud's syndrome without gangrene: Secondary | ICD-10-CM | POA: Diagnosis not present

## 2023-03-08 DIAGNOSIS — Z1272 Encounter for screening for malignant neoplasm of vagina: Secondary | ICD-10-CM | POA: Diagnosis not present

## 2023-03-08 DIAGNOSIS — M8580 Other specified disorders of bone density and structure, unspecified site: Secondary | ICD-10-CM | POA: Diagnosis not present

## 2023-03-08 DIAGNOSIS — Z6820 Body mass index (BMI) 20.0-20.9, adult: Secondary | ICD-10-CM | POA: Diagnosis not present

## 2023-03-30 DIAGNOSIS — H5212 Myopia, left eye: Secondary | ICD-10-CM | POA: Diagnosis not present

## 2023-03-30 DIAGNOSIS — H04123 Dry eye syndrome of bilateral lacrimal glands: Secondary | ICD-10-CM | POA: Diagnosis not present

## 2023-04-05 DIAGNOSIS — D72819 Decreased white blood cell count, unspecified: Secondary | ICD-10-CM | POA: Diagnosis not present

## 2023-07-06 DIAGNOSIS — E041 Nontoxic single thyroid nodule: Secondary | ICD-10-CM | POA: Diagnosis not present

## 2023-07-06 DIAGNOSIS — R3589 Other polyuria: Secondary | ICD-10-CM | POA: Diagnosis not present

## 2023-07-06 DIAGNOSIS — M8589 Other specified disorders of bone density and structure, multiple sites: Secondary | ICD-10-CM | POA: Diagnosis not present

## 2023-07-06 DIAGNOSIS — M6281 Muscle weakness (generalized): Secondary | ICD-10-CM | POA: Diagnosis not present

## 2023-07-06 DIAGNOSIS — R531 Weakness: Secondary | ICD-10-CM | POA: Diagnosis not present

## 2023-09-13 DIAGNOSIS — M25521 Pain in right elbow: Secondary | ICD-10-CM | POA: Diagnosis not present

## 2023-09-13 DIAGNOSIS — S52124A Nondisplaced fracture of head of right radius, initial encounter for closed fracture: Secondary | ICD-10-CM | POA: Diagnosis not present

## 2023-09-28 DIAGNOSIS — M25521 Pain in right elbow: Secondary | ICD-10-CM | POA: Diagnosis not present

## 2023-10-02 DIAGNOSIS — M25521 Pain in right elbow: Secondary | ICD-10-CM | POA: Diagnosis not present

## 2023-12-13 DIAGNOSIS — M25521 Pain in right elbow: Secondary | ICD-10-CM | POA: Diagnosis not present

## 2023-12-18 DIAGNOSIS — M25521 Pain in right elbow: Secondary | ICD-10-CM | POA: Diagnosis not present

## 2023-12-26 DIAGNOSIS — M25521 Pain in right elbow: Secondary | ICD-10-CM | POA: Diagnosis not present

## 2024-01-02 DIAGNOSIS — M25521 Pain in right elbow: Secondary | ICD-10-CM | POA: Diagnosis not present

## 2024-01-08 DIAGNOSIS — M25521 Pain in right elbow: Secondary | ICD-10-CM | POA: Diagnosis not present

## 2024-01-17 DIAGNOSIS — J029 Acute pharyngitis, unspecified: Secondary | ICD-10-CM | POA: Diagnosis not present

## 2024-01-17 DIAGNOSIS — J069 Acute upper respiratory infection, unspecified: Secondary | ICD-10-CM | POA: Diagnosis not present

## 2024-01-17 DIAGNOSIS — R058 Other specified cough: Secondary | ICD-10-CM | POA: Diagnosis not present

## 2024-01-23 DIAGNOSIS — M25521 Pain in right elbow: Secondary | ICD-10-CM | POA: Diagnosis not present

## 2024-02-11 DIAGNOSIS — M25521 Pain in right elbow: Secondary | ICD-10-CM | POA: Diagnosis not present

## 2024-02-21 DIAGNOSIS — Z853 Personal history of malignant neoplasm of breast: Secondary | ICD-10-CM | POA: Diagnosis not present

## 2024-02-21 DIAGNOSIS — E042 Nontoxic multinodular goiter: Secondary | ICD-10-CM | POA: Diagnosis not present

## 2024-02-21 DIAGNOSIS — E041 Nontoxic single thyroid nodule: Secondary | ICD-10-CM | POA: Diagnosis not present

## 2024-02-26 DIAGNOSIS — E041 Nontoxic single thyroid nodule: Secondary | ICD-10-CM | POA: Diagnosis not present

## 2024-02-27 DIAGNOSIS — Z8349 Family history of other endocrine, nutritional and metabolic diseases: Secondary | ICD-10-CM | POA: Diagnosis not present

## 2024-02-27 DIAGNOSIS — E041 Nontoxic single thyroid nodule: Secondary | ICD-10-CM | POA: Diagnosis not present

## 2024-02-27 DIAGNOSIS — Z2821 Immunization not carried out because of patient refusal: Secondary | ICD-10-CM | POA: Diagnosis not present

## 2024-02-27 DIAGNOSIS — E039 Hypothyroidism, unspecified: Secondary | ICD-10-CM | POA: Diagnosis not present

## 2024-03-12 DIAGNOSIS — R499 Unspecified voice and resonance disorder: Secondary | ICD-10-CM | POA: Diagnosis not present

## 2024-03-12 DIAGNOSIS — E041 Nontoxic single thyroid nodule: Secondary | ICD-10-CM | POA: Diagnosis not present

## 2024-03-12 DIAGNOSIS — Z853 Personal history of malignant neoplasm of breast: Secondary | ICD-10-CM | POA: Diagnosis not present

## 2024-03-12 DIAGNOSIS — Z9013 Acquired absence of bilateral breasts and nipples: Secondary | ICD-10-CM | POA: Diagnosis not present

## 2024-04-10 DIAGNOSIS — E041 Nontoxic single thyroid nodule: Secondary | ICD-10-CM | POA: Diagnosis not present

## 2024-04-10 DIAGNOSIS — Z1212 Encounter for screening for malignant neoplasm of rectum: Secondary | ICD-10-CM | POA: Diagnosis not present

## 2024-04-10 DIAGNOSIS — E039 Hypothyroidism, unspecified: Secondary | ICD-10-CM | POA: Diagnosis not present

## 2024-04-10 DIAGNOSIS — M8589 Other specified disorders of bone density and structure, multiple sites: Secondary | ICD-10-CM | POA: Diagnosis not present

## 2024-04-14 DIAGNOSIS — D2261 Melanocytic nevi of right upper limb, including shoulder: Secondary | ICD-10-CM | POA: Diagnosis not present

## 2024-04-14 DIAGNOSIS — D2262 Melanocytic nevi of left upper limb, including shoulder: Secondary | ICD-10-CM | POA: Diagnosis not present

## 2024-04-14 DIAGNOSIS — L819 Disorder of pigmentation, unspecified: Secondary | ICD-10-CM | POA: Diagnosis not present

## 2024-04-14 DIAGNOSIS — Z1331 Encounter for screening for depression: Secondary | ICD-10-CM | POA: Diagnosis not present

## 2024-04-14 DIAGNOSIS — K589 Irritable bowel syndrome without diarrhea: Secondary | ICD-10-CM | POA: Diagnosis not present

## 2024-04-14 DIAGNOSIS — L573 Poikiloderma of Civatte: Secondary | ICD-10-CM | POA: Diagnosis not present

## 2024-04-14 DIAGNOSIS — Z0000 Encounter for general adult medical examination without abnormal findings: Secondary | ICD-10-CM | POA: Diagnosis not present

## 2024-04-14 DIAGNOSIS — R82998 Other abnormal findings in urine: Secondary | ICD-10-CM | POA: Diagnosis not present

## 2024-05-30 ENCOUNTER — Other Ambulatory Visit (HOSPITAL_BASED_OUTPATIENT_CLINIC_OR_DEPARTMENT_OTHER): Payer: Self-pay | Admitting: Registered Nurse

## 2024-05-30 DIAGNOSIS — R509 Fever, unspecified: Secondary | ICD-10-CM

## 2024-05-30 DIAGNOSIS — R102 Pelvic and perineal pain: Secondary | ICD-10-CM

## 2024-05-30 DIAGNOSIS — R109 Unspecified abdominal pain: Secondary | ICD-10-CM

## 2024-05-30 DIAGNOSIS — N390 Urinary tract infection, site not specified: Secondary | ICD-10-CM | POA: Diagnosis not present

## 2024-05-30 DIAGNOSIS — D6800 Von Willebrand disease, unspecified: Secondary | ICD-10-CM | POA: Diagnosis not present

## 2024-05-31 ENCOUNTER — Ambulatory Visit (HOSPITAL_BASED_OUTPATIENT_CLINIC_OR_DEPARTMENT_OTHER)
Admission: RE | Admit: 2024-05-31 | Discharge: 2024-05-31 | Disposition: A | Discharge status: Home / Self Care | Source: Ambulatory Visit | Attending: Registered Nurse | Admitting: Registered Nurse

## 2024-05-31 DIAGNOSIS — R509 Fever, unspecified: Secondary | ICD-10-CM | POA: Diagnosis not present

## 2024-05-31 DIAGNOSIS — R160 Hepatomegaly, not elsewhere classified: Secondary | ICD-10-CM | POA: Diagnosis not present

## 2024-05-31 DIAGNOSIS — K5732 Diverticulitis of large intestine without perforation or abscess without bleeding: Secondary | ICD-10-CM | POA: Diagnosis not present

## 2024-05-31 DIAGNOSIS — R102 Pelvic and perineal pain: Secondary | ICD-10-CM | POA: Insufficient documentation

## 2024-05-31 DIAGNOSIS — R109 Unspecified abdominal pain: Secondary | ICD-10-CM | POA: Insufficient documentation

## 2024-05-31 MED ORDER — IOHEXOL 300 MG/ML  SOLN: 80.0000 mL | Freq: Once | INTRAMUSCULAR | Status: AC | PRN

## 2024-06-04 DIAGNOSIS — Z6820 Body mass index (BMI) 20.0-20.9, adult: Secondary | ICD-10-CM | POA: Diagnosis not present

## 2024-06-04 DIAGNOSIS — Z1272 Encounter for screening for malignant neoplasm of vagina: Secondary | ICD-10-CM | POA: Diagnosis not present

## 2024-06-04 DIAGNOSIS — Z01419 Encounter for gynecological examination (general) (routine) without abnormal findings: Secondary | ICD-10-CM | POA: Diagnosis not present

## 2024-08-13 DIAGNOSIS — Z8349 Family history of other endocrine, nutritional and metabolic diseases: Secondary | ICD-10-CM | POA: Diagnosis not present

## 2024-08-13 DIAGNOSIS — E041 Nontoxic single thyroid nodule: Secondary | ICD-10-CM | POA: Diagnosis not present

## 2024-08-13 DIAGNOSIS — E039 Hypothyroidism, unspecified: Secondary | ICD-10-CM | POA: Diagnosis not present

## 2024-08-29 DIAGNOSIS — E039 Hypothyroidism, unspecified: Secondary | ICD-10-CM | POA: Diagnosis not present

## 2024-08-29 DIAGNOSIS — E041 Nontoxic single thyroid nodule: Secondary | ICD-10-CM | POA: Diagnosis not present

## 2024-09-01 DIAGNOSIS — Z13820 Encounter for screening for osteoporosis: Secondary | ICD-10-CM | POA: Diagnosis not present

## 2024-09-11 DIAGNOSIS — H04123 Dry eye syndrome of bilateral lacrimal glands: Secondary | ICD-10-CM | POA: Diagnosis not present

## 2024-09-11 DIAGNOSIS — H5212 Myopia, left eye: Secondary | ICD-10-CM | POA: Diagnosis not present

## 2024-09-23 DIAGNOSIS — L905 Scar conditions and fibrosis of skin: Secondary | ICD-10-CM | POA: Diagnosis not present

## 2024-09-23 DIAGNOSIS — Z853 Personal history of malignant neoplasm of breast: Secondary | ICD-10-CM | POA: Diagnosis not present

## 2024-09-23 DIAGNOSIS — N650 Deformity of reconstructed breast: Secondary | ICD-10-CM | POA: Diagnosis not present

## 2024-10-07 DIAGNOSIS — N644 Mastodynia: Secondary | ICD-10-CM | POA: Diagnosis not present

## 2024-10-07 DIAGNOSIS — Z9013 Acquired absence of bilateral breasts and nipples: Secondary | ICD-10-CM | POA: Diagnosis not present

## 2024-10-07 DIAGNOSIS — Z853 Personal history of malignant neoplasm of breast: Secondary | ICD-10-CM | POA: Diagnosis not present
